# Patient Record
Sex: Male | Born: 1935 | Race: White | Hispanic: No | Marital: Married | Smoking: Former smoker
Health system: Southern US, Community
[De-identification: ages and names within clinical notes are randomized; demographics above are authoritative.]

## PROBLEM LIST (undated history)

## (undated) DIAGNOSIS — E039 Hypothyroidism, unspecified: Secondary | ICD-10-CM

## (undated) DIAGNOSIS — G9389 Other specified disorders of brain: Secondary | ICD-10-CM

## (undated) DIAGNOSIS — Z87891 Personal history of nicotine dependence: Secondary | ICD-10-CM

## (undated) DIAGNOSIS — I10 Essential (primary) hypertension: Secondary | ICD-10-CM

## (undated) DIAGNOSIS — N529 Male erectile dysfunction, unspecified: Secondary | ICD-10-CM

## (undated) DIAGNOSIS — I2510 Atherosclerotic heart disease of native coronary artery without angina pectoris: Secondary | ICD-10-CM

## (undated) DIAGNOSIS — M1990 Unspecified osteoarthritis, unspecified site: Secondary | ICD-10-CM

## (undated) DIAGNOSIS — E785 Hyperlipidemia, unspecified: Secondary | ICD-10-CM

## (undated) DIAGNOSIS — I214 Non-ST elevation (NSTEMI) myocardial infarction: Secondary | ICD-10-CM

## (undated) DIAGNOSIS — K635 Polyp of colon: Secondary | ICD-10-CM

## (undated) DIAGNOSIS — E291 Testicular hypofunction: Secondary | ICD-10-CM

## (undated) DIAGNOSIS — Z8739 Personal history of other diseases of the musculoskeletal system and connective tissue: Secondary | ICD-10-CM

## (undated) HISTORY — DX: Male erectile dysfunction, unspecified: N52.9

## (undated) HISTORY — DX: Unspecified osteoarthritis, unspecified site: M19.90

## (undated) HISTORY — DX: Polyp of colon: K63.5

## (undated) HISTORY — DX: Other specified disorders of brain: G93.89

## (undated) HISTORY — DX: Hyperlipidemia, unspecified: E78.5

## (undated) HISTORY — PX: ESOPHAGOGASTRODUODENOSCOPY: SHX1529

## (undated) HISTORY — DX: Hypothyroidism, unspecified: E03.9

## (undated) HISTORY — PX: LARYNX SURGERY: SHX692

## (undated) HISTORY — PX: LUMBAR DISC SURGERY: SHX700

## (undated) HISTORY — DX: Non-ST elevation (NSTEMI) myocardial infarction: I21.4

## (undated) HISTORY — DX: Essential (primary) hypertension: I10

## (undated) HISTORY — DX: Personal history of nicotine dependence: Z87.891

## (undated) HISTORY — DX: Testicular hypofunction: E29.1

## (undated) HISTORY — DX: Personal history of other diseases of the musculoskeletal system and connective tissue: Z87.39

## (undated) HISTORY — DX: Atherosclerotic heart disease of native coronary artery without angina pectoris: I25.10

---

## 1997-07-27 ENCOUNTER — Other Ambulatory Visit: Admission: RE | Admit: 1997-07-27 | Discharge: 1997-07-27 | Payer: Self-pay | Admitting: Urology

## 1998-01-02 ENCOUNTER — Emergency Department (HOSPITAL_COMMUNITY): Admission: EM | Admit: 1998-01-02 | Discharge: 1998-01-02 | Payer: Self-pay | Admitting: Family Medicine

## 1998-01-02 ENCOUNTER — Encounter: Payer: Self-pay | Admitting: Emergency Medicine

## 1998-01-05 ENCOUNTER — Emergency Department (HOSPITAL_COMMUNITY): Admission: EM | Admit: 1998-01-05 | Discharge: 1998-01-05 | Payer: Self-pay | Admitting: Emergency Medicine

## 1998-02-13 ENCOUNTER — Encounter: Payer: Self-pay | Admitting: *Deleted

## 1998-02-13 ENCOUNTER — Ambulatory Visit: Admission: RE | Admit: 1998-02-13 | Discharge: 1998-02-13 | Payer: Self-pay | Admitting: *Deleted

## 2007-02-10 DIAGNOSIS — I214 Non-ST elevation (NSTEMI) myocardial infarction: Secondary | ICD-10-CM

## 2007-02-10 HISTORY — PX: CARDIAC CATHETERIZATION: SHX172

## 2007-02-10 HISTORY — DX: Non-ST elevation (NSTEMI) myocardial infarction: I21.4

## 2007-02-10 HISTORY — PX: INGUINAL HERNIA REPAIR: SHX194

## 2007-02-17 ENCOUNTER — Inpatient Hospital Stay (HOSPITAL_COMMUNITY): Admission: EM | Admit: 2007-02-17 | Discharge: 2007-02-19 | Payer: Self-pay | Admitting: Emergency Medicine

## 2007-04-07 ENCOUNTER — Emergency Department (HOSPITAL_COMMUNITY): Admission: EM | Admit: 2007-04-07 | Discharge: 2007-04-07 | Payer: Self-pay | Admitting: Emergency Medicine

## 2010-06-24 NOTE — Cardiovascular Report (Signed)
[name] Potter, [name] Potter              ACCOUNT NO.:  [id number]   MEDICAL RECORD NO.:  [id number]          PATIENT TYPE:  INP   LOCATION:  3729                         FACILITY:  [facility]   PHYSICIAN:  Darlin Priestly, MD  DATE OF BIRTH:  1935/12/19   DATE OF PROCEDURE:  02/18/2007  DATE OF DISCHARGE:                            CARDIAC CATHETERIZATION   PROCEDURE:  1. Left heart catheterization.  2. Coronary angiography.  3. Left ventriculogram.  4. Valvular aortogram.   COMPLICATIONS:  None.   INDICATIONS:  Mr. [name] Potter is a 75 year old male patient followed [facility] with a history of hypertension, hypothyroidism,  history of remote tobacco use, quit in July 2007 who was admitted to the  ER on February 17, 2007, with stuttering chest pain on for the last 2  days.  He did such a rule in with a troponin of 1.45.  He is now  referred for cardiac catheterization to rule out significant CAD.   DESCRIPTION OF OPERATION:  After informed consent, the patient was sent  to the cardiac cath lab.  Right groin shaved, prepped and draped in  usual sterile fashion.  Monitors were established using modified  Seldinger.  A 6-French arterial sheath in right femoral artery.  A 6-  [place] diagnostic catheter was used to perform diagnostic angiography.   Left main is a medium-sized vessel with no disease.   The LAD is noted to be diffusely calcified throughout its proximal  midsegment.  There is diffuse 50-60% scattered lesions throughout the  proximal mid and early distal portion of the LAD.  However, none of  these appear to be high-grade.   First diagonal is a small vessel which bifurcates distally to the 95%  ostial lesion.   Second diagonal is small vessel with no disease.   Left circumflex is a medium-sized vessel coursing to the AV groove and  gives a two obtuse marginal branches.  AV circumflex noted a 40%  stenosis prior to the takeoff of the second obtuse marginal.   The  first OM is a medium-size vessel with 95% ostial lesion followed by  diffuse 50% scattered disease.   Second OM is a medium-size vessel which bifurcates distally.  In the  early prior bifurcation, there is a small 90% lower bifurcating lesion.   The right coronary artery is a medium size vessel that is dominant,  gives rise to a PDA versus the posterolateral branch.  RCA is noted at  30% proximal, 50% early mid, 40% distal disease.  The PLA is a small to  medium vessel with a 95% midvessel lesion at a bifurcation.   Left ventriculogram reveals a preserved EF of 60%.   Abdominal aortogram reveals small infrarenal aortic aneurysm.  There is  mild 30% right renal artery stenosis.   HEMODYNAMIC RESULTS:  Systemic arterial pressure 120/62, LV systemic  pressure 122/3, LVDP of 10.   CONCLUSION:  1. Significant three-vessel CAD involving small vessel branches.  2. Normal LV systolic function.  3. Small infrarenal aortic aneurysm.  4. Mild right renal artery stenosis.  Darlin Priestly, MD  Electronically Signed     RHM/MEDQ  D:  02/18/2007  T:  02/18/2007  Job:  [id number]   cc:   Nicki Guadalajara, M.D.

## 2010-06-27 NOTE — Discharge Summary (Signed)
[name] Potter, [name] NO.:  [id number]   MEDICAL RECORD NO.:  [id number]          PATIENT TYPE:  INP   LOCATION:  3729                         FACILITY:  [facility]   PHYSICIAN:  Nicki Guadalajara, M.D.     DATE OF BIRTH:  Jun 26, 1935   DATE OF ADMISSION:  02/17/2007  DATE OF DISCHARGE:  02/19/2007                               DISCHARGE SUMMARY   DISCHARGE DIAGNOSES:  1. Unstable angina, catheterization this admission revealing diffuse      small vessel disease to be treated medically.  2. Preserved left ventricular function.  3. Treated hypertension.  4. Dyslipidemia.  The patient has been intolerant to statins in the      past.  5. Prior back surgery.  6. History of osteomyelitis.  7. Amputation of the right second digit.  8. Smoking, quit July 2007.   HOSPITAL COURSE:  The patient is a 75 year old male who is followed at  the [place] in [place].  He presented February 17, 2007, with chest pain  consistent with unstable angina.  Medications at home are HCTZ,  Synthroid and fish oil.  He was admitted to telemetry.  Initial point a  care markers were negative.  Diagnostic catheterization was done February 18, 2007, which revealed 50% proximal RCA, 95% distal RCA, normal left  main, 95% proximal first diagonal, 60% distal LAD, 40% circumflex, 95%  proximal OM-1, 90% distal circumflex.  Plan is for medical therapy for  now.  The patient was transferred to telemetry and ambulated and we feel  he can be discharged February 19, 2007.  We did arrange for him to get a  days worth of medication until he can get to the [place].   DISCHARGE MEDICATIONS:  1. Synthroid at his home dose.  2. Metoprolol 25 mg b.i.d.  3. Fish oil two caps twice a day.  4. Nitro-Dur 0.2 mg patch on at 8 a.m., off at 8 p.m.  5. Benazepril 2.5 mg a day.  6. Simvastatin 80 mg 1/2 tablet a day.  7. Coated aspirin 325 mg a day.  8. Nitroglycerin sublingual p.r.n.  9. Plavix 75 mg a day.   LABORATORY DATA  AND X-RAY FINDINGS:  White count 7.2, hemoglobin 13.0,  hematocrit 37.6, platelets 185.  INR is 1.1.  Sodium 137, potassium 3.9,  BUN 11, creatinine 1.0.  Liver functions were normal.  CKs did bump to  185 with 16 MBs and a troponin of 1.45.  Cholesterol is 219, HDL 27,  triglycerides 231.  TSH 3.69, LDL 146.  UA is negative.   Telemetry shows sinus rhythm, sinus bradycardia.  EKG is not in the  chart, but is reported to show sinus rhythm, nonspecific T-wave changes,  but no acute changes.   DISPOSITION:  The patient is discharged.  It appears he actually had an  SEMI by enzymes.  Plan is for medical therapy.  Dr. Jenne Campus did note  that he could consider PCI of the RCA if he had recurrent unstable  angina.  He will follow up at the [place].      [name] Potter, P.A.  ______________________________  Nicki Guadalajara, M.D.    Lenard Lance  D:  03/07/2007  T:  03/08/2007  Job:  [id number]

## 2010-10-30 LAB — DIFFERENTIAL
Basophils Absolute: 0.1
Basophils Relative: 1
Eosinophils Absolute: 0.2
Eosinophils Relative: 3
Lymphocytes Relative: 22
Lymphs Abs: 1.5
Monocytes Absolute: 0.8
Monocytes Relative: 12
Neutro Abs: 4.1
Neutrophils Relative %: 62

## 2010-10-30 LAB — CBC
HCT: 42.5
Hemoglobin: 14.8
MCHC: 33.9
MCHC: 34.6
MCHC: 34.7
MCV: 88.5
MCV: 89.8
Platelets: 185
Platelets: 252
RBC: 4.44
RBC: 4.81
RDW: 12.8
WBC: 6.3
WBC: 6.6
WBC: 7.2

## 2010-10-30 LAB — COMPREHENSIVE METABOLIC PANEL WITH GFR
ALT: 27
AST: 32
Alkaline Phosphatase: 87
GFR calc Af Amer: >60
Glucose, Bld: 115 — ABNORMAL HIGH
Potassium: 3.5
Sodium: 137
Total Protein: 6.6

## 2010-10-30 LAB — BASIC METABOLIC PANEL
BUN: 11
Calcium: 9.1
Chloride: 103
Creatinine, Ser: 0.86
Creatinine, Ser: 1
GFR calc Af Amer: >60
GFR calc non Af Amer: >60

## 2010-10-30 LAB — I-STAT 8, (EC8 V) (CONVERTED LAB)
Acid-Base Excess: 3 — ABNORMAL HIGH
BUN: 10
Bicarbonate: 26.9 — ABNORMAL HIGH
Chloride: 104
Glucose, Bld: 166 — ABNORMAL HIGH
HCT: 47
Hemoglobin: 16
Operator id: 161631
Potassium: 3.8
Sodium: 136
TCO2: 28
pCO2, Ven: 37.2 — ABNORMAL LOW
pH, Ven: 7.468 — ABNORMAL HIGH

## 2010-10-30 LAB — URINALYSIS, ROUTINE W REFLEX MICROSCOPIC
Nitrite: NEGATIVE
Protein, ur: NEGATIVE
Urobilinogen, UA: 1

## 2010-10-30 LAB — TROPONIN I
Troponin I: 0.46 — ABNORMAL HIGH
Troponin I: 1.45

## 2010-10-30 LAB — POCT CARDIAC MARKERS
CKMB, poc: 5.7
Myoglobin, poc: 223
Operator id: 161631
Troponin i, poc: <0.05

## 2010-10-30 LAB — CK TOTAL AND CKMB (NOT AT ARMC)
CK, MB: 10.2 — ABNORMAL HIGH
CK, MB: 16.1 — ABNORMAL HIGH
Relative Index: 6 — ABNORMAL HIGH
Relative Index: 8.7 — ABNORMAL HIGH
Total CK: 170
Total CK: 185

## 2010-10-30 LAB — COMPREHENSIVE METABOLIC PANEL
Albumin: 3.7
BUN: 10
CO2: 27
Calcium: 9.2
Chloride: 103
Creatinine, Ser: 0.86
GFR calc non Af Amer: >60
Total Bilirubin: 0.4

## 2010-10-30 LAB — MAGNESIUM: Magnesium: 2.1

## 2010-10-30 LAB — APTT: aPTT: 71 — ABNORMAL HIGH

## 2010-10-30 LAB — POCT I-STAT CREATININE
Creatinine, Ser: 1
Operator id: 161631

## 2010-10-30 LAB — LIPID PANEL
Triglycerides: 231 — ABNORMAL HIGH
VLDL: 46 — ABNORMAL HIGH

## 2010-10-30 LAB — HEPARIN LEVEL (UNFRACTIONATED): Heparin Unfractionated: 0.32

## 2010-10-31 LAB — POCT CARDIAC MARKERS
CKMB, poc: 1.9
CKMB, poc: 2.8
Myoglobin, poc: 53
Myoglobin, poc: 62.9
Operator id: 133351
Operator id: 133351
Troponin i, poc: <0.05
Troponin i, poc: <0.05

## 2010-10-31 LAB — I-STAT 8, (EC8 V) (CONVERTED LAB)
Bicarbonate: 23.6
Glucose, Bld: 117 — ABNORMAL HIGH
Sodium: 139
TCO2: 25
pCO2, Ven: 33 — ABNORMAL LOW
pH, Ven: 7.463 — ABNORMAL HIGH

## 2010-10-31 LAB — POCT I-STAT CREATININE: Operator id: 133351

## 2011-03-12 ENCOUNTER — Ambulatory Visit (INDEPENDENT_AMBULATORY_CARE_PROVIDER_SITE_OTHER): Payer: Medicare HMO | Admitting: Family Medicine

## 2011-03-12 ENCOUNTER — Encounter: Payer: Self-pay | Admitting: Family Medicine

## 2011-03-12 VITALS — BP 106/61 | HR 61 | Temp 98.1°F | Wt 181.0 lb

## 2011-03-12 DIAGNOSIS — N410 Acute prostatitis: Secondary | ICD-10-CM | POA: Insufficient documentation

## 2011-03-12 DIAGNOSIS — R300 Dysuria: Secondary | ICD-10-CM

## 2011-03-12 LAB — POCT URINALYSIS DIPSTICK
Nitrite, UA: NEGATIVE
Urobilinogen, UA: 0.2
pH, UA: 5.5

## 2011-03-12 MED ORDER — CIPROFLOXACIN HCL 500 MG PO TABS: 500.0000 mg | ORAL_TABLET | Freq: Two times a day (BID) | ORAL | Status: AC

## 2011-03-12 NOTE — Progress Notes (Signed)
Office Note 03/12/2011  CC:  Chief Complaint  Patient presents with  . Establish Care    burning with urination, nocturia, small output, fever    HPI:  [name] Potter is a 76 y.o. White male who is here to establish care and discuss urinary complaints. [facility] [place] [facility] [place] [facility]. Old records in EPIC/HL reviewed prior to or during today's visit.  Onset 3 days ago of fatigue, dysuria, urinary frequency, nocturia/urgency, incomplete emptying.  Tactile fever x 1 night.  No hematuria.  Urine is dark as per his usual. Denies past hx of prostate infection or UTI. Describes nocturia x 3 usually but no significant bladder obstructive sx's normally. Feels better today. Thinks last prostate ca screening was about 6 yrs ago.  Past Medical History  Diagnosis Date  . HTN (hypertension)   . Hypothyroidism   . History of tobacco abuse quit 2007  . 3-vessel CAD     small vessel dz on cath 02/2007; medical mgmt  (Dr. Jenne Campus)  . Hyperlipidemia     Hx of intolerance to statins  . History of osteomyelitis   . Myocardial infarction, subendocardial 02/2007    by enzymes  . Osteoarthritis     mainly left thumb  . Colon polyp     ?hyperplastic; pt was told to repeat in 10 yrs per his report.  . Hypogonadism male     topical replacement rx'd by his [place] cardiologist was not helpful per pt  . Erectile dysfunction     Past Surgical History  Procedure Date  . Cardiac catheterization 02/2007    3 vessel CAD, normal EF, small infrarenal AAA, mild right RAS (30%)  . Lumbar disc surgery 30+ yrs ago    discectomy x 1  . Inguinal hernia repair 2009    right    Family History  Problem Relation Age of Onset  . Diabetes Mother   . Cancer Sister     lung cancer, smoker    History   Social History  . Marital Status: Married    Spouse Name: N/A    Number of Children: N/A  . Years of Education: N/A   Occupational  History  . Not on file.   Social History Main Topics  . Smoking status: Former Smoker    Types: Cigarettes    Quit date: 02/10/2007  . Smokeless tobacco: Former Neurosurgeon    Types: Chew    Quit date: 02/10/2011   Comment: pt began chewing tobacco after stopping cigs in 2009, quit chew in 02/2011  . Alcohol Use: No  . Drug Use: No  . Sexually Active: Not on file   Other Topics Concern  . Not on file   Social History Narrative   Married x 50 yrs, 2 children, one is deceased.Orig from [place] area, living in [place].Retired Psychologist, occupational (2011).  Was in the [employer]; no combat.Tobacco 65 pack-yr hx, quit 2007/08.Chewed tobacco x 4-5 yrs, quit 2013.Exercises 3-4 days per week at [facility].    Outpatient Encounter Prescriptions as of 03/12/2011  Medication Sig Dispense Refill  . aspirin 325 MG EC tablet 1/2 tab daily      . cyanocobalamin 500 MCG tablet Take 1,000 mcg by mouth daily.      Marland Kitchen levothyroxine (SYNTHROID, LEVOTHROID) 137 MCG tablet Take 137 mcg by mouth daily.      Marland Kitchen lisinopril-hydrochlorothiazide (PRINZIDE,ZESTORETIC) 20-12.5 MG per tablet Take 1 tablet by mouth daily.      Marland Kitchen  metoprolol tartrate (LOPRESSOR) 25 MG tablet Take 12.5 mg by mouth 2 (two) times daily.      . Omega-3 Fatty Acids (FISH OIL) 1000 MG CAPS Take 1 capsule by mouth daily.      . rosuvastatin (CRESTOR) 40 MG tablet Take 20 mg by mouth daily.      . ciprofloxacin (CIPRO) 500 MG tablet Take 1 tablet (500 mg total) by mouth 2 (two) times daily.  28 tablet  0    No Known Allergies  ROS Review of Systems  Constitutional:       See HPI   HENT: Negative for congestion and sore throat.   Eyes: Negative for visual disturbance.  Respiratory: Negative for cough.   Cardiovascular: Negative for chest pain.  Gastrointestinal: Negative for nausea and abdominal pain.  Genitourinary:       See HPI  Musculoskeletal: Negative for back pain and joint swelling.  Skin: Negative for rash.  Neurological: Negative for weakness and  headaches.  Hematological: Negative for adenopathy.    PE; Blood pressure 106/61, pulse 61, temperature 98.1 F (36.7 C), temperature source Temporal, weight 181 lb (82.101 kg), SpO2 95.00%. Gen: Alert, well appearing.  Patient is oriented to person, place, time, and situation. ENT: Ears: EACs clear, normal epithelium.  TMs with good light reflex and landmarks bilaterally.  Eyes: no injection, icteris, swelling, or exudate.  EOMI, PERRLA. Nose: no drainage or turbinate edema/swelling.  No injection or focal lesion.  Mouth: lips without lesion/swelling.  Oral mucosa pink and moist.  Dentition intact and without obvious caries or gingival swelling.  Oropharynx without erythema, exudate, or swelling.  Neck - No masses or thyromegaly or limitation in range of motion CV: RRR, no m/r/g.   LUNGS: CTA bilat, nonlabored resps, good aeration in all lung fields. ABD: soft, NT, ND, BS normal.  No hepatospenomegaly or mass.  No bruits. EXT: no clubbing, cyanosis, or edema.  Rectal exam: negative without mass, lesions or tenderness, sphincter tone normal.  Prostate is fairly firm diffusely, without focal nodularity, mildly tender.  I don't appreciate significant prostate enlargement.  Pertinent labs:  UA showed moderate blood and moderate leukocytes, 100mg /dl protein, trace ketones  ASSESSMENT AND PLAN:   New pt: obtain old records.  Prostatitis, acute Cipro 500mg  bid x14d. Sent urine for c/s. Call or return if sx's not improved in about 5d.   He'll continue to get the majority of his routine chronic medical f/u via the [place] in [place]. He'll continue to see his cardiologist at [facility].  Return if symptoms worsen or fail to improve.

## 2011-03-12 NOTE — Assessment & Plan Note (Signed)
Cipro 500mg  bid x14d. Sent urine for c/s. Call or return if sx's not improved in about 5d.

## 2011-03-15 LAB — URINE CULTURE: Colony Count: >=100000

## 2011-03-16 ENCOUNTER — Encounter: Payer: Self-pay | Admitting: Family Medicine

## 2012-01-27 ENCOUNTER — Ambulatory Visit (INDEPENDENT_AMBULATORY_CARE_PROVIDER_SITE_OTHER): Payer: Medicare HMO | Admitting: Family Medicine

## 2012-01-27 ENCOUNTER — Encounter: Payer: Self-pay | Admitting: Family Medicine

## 2012-01-27 VITALS — BP 145/72 | HR 67 | Temp 98.8°F | Wt 182.0 lb

## 2012-01-27 DIAGNOSIS — J069 Acute upper respiratory infection, unspecified: Secondary | ICD-10-CM

## 2012-01-27 DIAGNOSIS — J209 Acute bronchitis, unspecified: Secondary | ICD-10-CM

## 2012-01-27 MED ORDER — PREDNISONE 20 MG PO TABS: ORAL_TABLET | ORAL | Status: DC

## 2012-01-27 MED ORDER — AZITHROMYCIN 250 MG PO TABS: ORAL_TABLET | ORAL | Status: DC

## 2012-01-27 NOTE — Assessment & Plan Note (Signed)
With prolonged URI sx's as well. No sign of RAD. Will give prednisone 40mg  qd x 5d and azithromycin x 5d (Z pack). Continue tessalon perles or may also try OTC mucinex DM prn.

## 2012-01-27 NOTE — Patient Instructions (Addendum)
Try mucinex DM (OTC) or generic robitussin DM as per the instructions on the container. Use OTC nasal saline spray (generic) 2-3 times per day to irrigate and moisturize your nasal passages.

## 2012-01-27 NOTE — Progress Notes (Signed)
OFFICE NOTE  01/27/2012  CC:  Chief Complaint  Patient presents with  . Cough    since October, yellow phlegm     HPI: Patient is a 76 y.o. Caucasian male who is here for respiratory complaints. Pt presents complaining of respiratory symptoms for 2 months.  Primary symptoms are: cough, sinus pain, coughs up yellow phlegm.  Worst symptoms seems to be the cough.  Lately the symptoms seem to be waxing and waning--worse in daytime, ok in evenings.   [facility] and says he was rx'd tessalon perles and a nasal spray.  He does have some intermittent wheezing and feeling of chest tightness.  Has no inhaler at home. Pertinent negatives: No fevers.  Throat is normal.  Symptoms made worse by nothing.  Symptoms improved by evening time. Smoker? former Recent sick contact? Yes-wife Muscle or joint aches? no Flu shot this season at least 2 wks ago? yes  Additional ROS: no n/v/d or abdominal pain.  No rash.  No neck stiffness.   +Mild fatigue.  +Mild appetite loss.   Pertinent PMH:  Past Medical History  Diagnosis Date  . HTN (hypertension)   . Hypothyroidism   . History of tobacco abuse quit 2007  . 3-vessel CAD     small vessel dz on cath 02/2007; medical mgmt .  ETT at [facility] 01/23/11 was normal per pt report.  . Hyperlipidemia     Hx of intolerance to various statins  . History of osteomyelitis   . Myocardial infarction, subendocardial 02/2007    by enzymes  . Osteoarthritis     mainly left thumb  . Colon polyp     (The only old record available was a flex sig from 03/1998, which showed sigmoid diverticulosis and internal hemorrhoids--no polyps(?hyperplastic; pt was told to repeat in 10 yrs per his report.)  . Hypogonadism male     topical replacement rx'd by his [place] cardiologist was not helpful per pt  . Erectile dysfunction     MEDS:  Outpatient Prescriptions Prior to Visit  Medication Sig Dispense Refill  . aspirin 325 MG EC tablet 1/2 tab daily      .  cyanocobalamin 500 MCG tablet Take 1,000 mcg by mouth daily.      Marland Kitchen levothyroxine (SYNTHROID, LEVOTHROID) 137 MCG tablet Take 137 mcg by mouth daily.      Marland Kitchen lisinopril-hydrochlorothiazide (PRINZIDE,ZESTORETIC) 20-12.5 MG per tablet Take 1 tablet by mouth daily.      . metoprolol tartrate (LOPRESSOR) 25 MG tablet Take 12.5 mg by mouth 2 (two) times daily.      . Omega-3 Fatty Acids (FISH OIL) 1000 MG CAPS Take 2 capsules by mouth 2 (two) times daily.       . rosuvastatin (CRESTOR) 40 MG tablet Take 20 mg by mouth daily.       Last reviewed on 01/27/2012  9:40 AM by Jeoffrey Massed, MD  PE: Blood pressure 145/72, pulse 67, temperature 98.8 F (37.1 C), temperature source Temporal, weight 182 lb (82.555 kg), SpO2 96.00%. VS: noted--normal. Gen: alert, NAD, NONTOXIC APPEARING. HEENT: eyes without injection, drainage, or swelling.  Ears: EACs clear, TMs with normal light reflex and landmarks.  Nose: Clear rhinorrhea, with some dried, crusty exudate adherent to mildly injected mucosa.  No purulent d/c.  No paranasal sinus TTP.  No facial swelling.  Throat and mouth without focal lesion.  No pharyngial swelling, erythema, or exudate.   Neck: supple, no LAD.   LUNGS: CTA bilat, nonlabored  resps.   CV: RRR, no m/r/g. EXT: no c/c/e SKIN: no rash   IMPRESSION AND PLAN:  Acute bronchitis With prolonged URI sx's as well. No sign of RAD. Will give prednisone 40mg  qd x 5d and azithromycin x 5d (Z pack). Continue tessalon perles or may also try OTC mucinex DM prn.   An After Visit Summary was printed and given to the patient.  FOLLOW UP: 10-14d

## 2012-09-08 ENCOUNTER — Ambulatory Visit (INDEPENDENT_AMBULATORY_CARE_PROVIDER_SITE_OTHER): Payer: Medicare HMO | Admitting: Family Medicine

## 2012-09-08 ENCOUNTER — Encounter: Payer: Self-pay | Admitting: Family Medicine

## 2012-09-08 VITALS — BP 147/64 | HR 44 | Temp 99.1°F | Resp 16 | Ht 70.0 in | Wt 181.0 lb

## 2012-09-08 DIAGNOSIS — S20219A Contusion of unspecified front wall of thorax, initial encounter: Secondary | ICD-10-CM | POA: Insufficient documentation

## 2012-09-08 DIAGNOSIS — S20212A Contusion of left front wall of thorax, initial encounter: Secondary | ICD-10-CM

## 2012-09-08 DIAGNOSIS — F0390 Unspecified dementia without behavioral disturbance: Secondary | ICD-10-CM

## 2012-09-08 DIAGNOSIS — D496 Neoplasm of unspecified behavior of brain: Secondary | ICD-10-CM | POA: Insufficient documentation
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
## 2012-09-08 NOTE — Assessment & Plan Note (Signed)
Again, I was unaware of this dx in patient. He is on a med he can't recall the name of (from the [place]). ? Related to recently diagnosed brain tumor? [facility].

## 2012-09-08 NOTE — Assessment & Plan Note (Signed)
Patient makes it sounds like this is potentially curable.  He is set to go to [facility] for further eval/mgmt of this in the next week or so.

## 2013-01-16 ENCOUNTER — Encounter: Payer: Self-pay | Admitting: Family Medicine

## 2013-01-16 ENCOUNTER — Ambulatory Visit (INDEPENDENT_AMBULATORY_CARE_PROVIDER_SITE_OTHER): Payer: Medicare HMO | Admitting: Family Medicine

## 2013-01-16 VITALS — BP 124/77 | HR 54 | Temp 99.7°F | Resp 18 | Ht 70.0 in | Wt 182.0 lb

## 2013-01-16 DIAGNOSIS — J069 Acute upper respiratory infection, unspecified: Secondary | ICD-10-CM

## 2013-01-16 DIAGNOSIS — J209 Acute bronchitis, unspecified: Secondary | ICD-10-CM

## 2013-01-16 MED ORDER — AZITHROMYCIN 250 MG PO TABS: ORAL_TABLET | ORAL | Status: DC

## 2013-01-16 MED ORDER — METHYLPREDNISOLONE ACETATE 40 MG/ML IJ SUSP
40.0000 mg | Freq: Once | INTRAMUSCULAR | Status: AC
  Administered 2013-01-16: 40 mg via INTRAMUSCULAR

## 2013-01-16 MED ORDER — IPRATROPIUM BROMIDE 0.03 % NA SOLN: NASAL | Status: DC

## 2013-01-16 NOTE — Progress Notes (Addendum)
OFFICE NOTE Pre visit review using our clinic review tool, if applicable. No additional management support is needed unless otherwise documented below in the visit note.  01/16/2013  CC:  Chief Complaint  Patient presents with  . Nasal Congestion  . Cough     HPI: Patient is a 76 y.o. Caucasian male who is here for congestion/cough. Since I last saw him he has seen a specialist at [facility] for brain tumor, pt says he was told it was benign and he has f/u next month to see if it has grown any.  It was recommended that no surgery be done at this time.  Takes ibuprofen bid to help mild HAs he attributes to the tumor.  Onset of cough 3 d/a, fatigue, no fevers, nose runny for a couple of weeks now, has paranasal sinus pain/pressure.  No meds tried.  He has had his flu vaccine already per pt (at the [place]).  No SOB or chest pain.    Pertinent PMH:  Past Medical History  Diagnosis Date  . HTN (hypertension)   . Hypothyroidism   . History of tobacco abuse quit 2007  . 3-vessel CAD     small vessel dz on cath 02/2007; medical mgmt .  ETT at [facility] 01/23/11 was normal per pt report.  . Hyperlipidemia     Hx of intolerance to various statins  . History of osteomyelitis   . Myocardial infarction, subendocardial 02/2007    by enzymes  . Osteoarthritis     mainly left thumb  . Colon polyp     (The only old record available was a flex sig from 03/1998, which showed sigmoid diverticulosis and internal hemorrhoids--no polyps(?hyperplastic; pt was told to repeat in 10 yrs per his report.)  . Hypogonadism male     topical replacement rx'd by his [place] cardiologist was not helpful per pt  . Erectile dysfunction    Past surgical, social, and family history reviewed and no changes noted since last office visit.  MEDS:  Outpatient Prescriptions Prior to Visit  Medication Sig Dispense Refill  . aspirin 81 MG tablet Take 81 mg by mouth 2 (two) times daily.      . cyanocobalamin 500 MCG tablet Take 1,000  mcg by mouth daily.      Marland Kitchen levothyroxine (SYNTHROID, LEVOTHROID) 137 MCG tablet Take 137 mcg by mouth daily.      Marland Kitchen lisinopril-hydrochlorothiazide (PRINZIDE,ZESTORETIC) 20-12.5 MG per tablet Take 1 tablet by mouth daily.      . metoprolol tartrate (LOPRESSOR) 25 MG tablet Take 12.5 mg by mouth 2 (two) times daily.      . Omega-3 Fatty Acids (FISH OIL) 1000 MG CAPS Take 2 capsules by mouth 2 (two) times daily.       Marland Kitchen aspirin 325 MG EC tablet 1/2 tab daily      . azithromycin (ZITHROMAX) 250 MG tablet 2 tabs po qd x 1d, then 1 tab po qd x 4d  6 each  0  . predniSONE (DELTASONE) 20 MG tablet 2 tabs po qd x 5d  10 tablet  0  . rosuvastatin (CRESTOR) 40 MG tablet Take 20 mg by mouth daily.       No facility-administered medications prior to visit.    PE: Blood pressure 124/77, pulse 54, temperature 99.7 F (37.6 C), temperature source Temporal, resp. rate 18, height 5\' 10"  (1.778 m), weight 182 lb (82.555 kg), SpO2 98.00%. VS: noted--normal. Gen: alert, NAD, NONTOXIC APPEARING. HEENT: eyes without injection, drainage, or  swelling.  Ears: EACs clear, TMs with normal light reflex and landmarks.  Nose: Clear rhinorrhea, with some dried, crusty exudate adherent to mildly injected mucosa.  No purulent d/c.  No paranasal sinus TTP.  No facial swelling.  Throat and mouth without focal lesion.  No pharyngial swelling, erythema, or exudate.   Neck: supple, no LAD.   LUNGS: CTA bilat, nonlabored resps.   CV: RRR, no m/r/g. EXT: no c/c/e SKIN: no rash  IMPRESSION AND PLAN:  Prolonged URI with early bronchitis. Azithromycin x 5d, Depo-medrol 40mg  IM in office today. Recommended ipratropium nasal spray 0.3% tid prn. Also recommended OTC generic robitussin DM for cough.  FOLLOW UP: prn

## 2013-01-16 NOTE — Patient Instructions (Signed)
Buy generic robitussin DM to help with your cough.

## 2013-05-18 ENCOUNTER — Ambulatory Visit (INDEPENDENT_AMBULATORY_CARE_PROVIDER_SITE_OTHER): Payer: Medicare HMO | Admitting: Family Medicine

## 2013-05-18 ENCOUNTER — Encounter: Payer: Self-pay | Admitting: Family Medicine

## 2013-05-18 VITALS — BP 121/72 | HR 50 | Temp 98.4°F | Resp 20 | Ht 70.0 in | Wt 181.0 lb

## 2013-05-18 DIAGNOSIS — H60509 Unspecified acute noninfective otitis externa, unspecified ear: Secondary | ICD-10-CM

## 2013-05-18 DIAGNOSIS — H60399 Other infective otitis externa, unspecified ear: Secondary | ICD-10-CM

## 2013-05-18 MED ORDER — OFLOXACIN 0.3 % OT SOLN: 10.0000 [drp] | Freq: Every day | OTIC | Status: DC

## 2013-05-18 NOTE — Progress Notes (Signed)
Pre visit review using our clinic review tool, if applicable. No additional management support is needed unless otherwise documented below in the visit note. 

## 2013-05-18 NOTE — Progress Notes (Signed)
OFFICE NOTE  05/18/2013  CC:  Chief Complaint  Patient presents with  . Ear Drainage    both ear, left is worse  . Otalgia    x 3 weeks    HPI: Patient is a 78 y.o. Caucasian male who is here for ear pain. Onset 3-4 wks ago, both ears throbbing, draining, feels like he is talking in a drum.  He had a seizure 01/2013: describes tonic/clonic/generalized seizure. Got a w/u after, started on seizure med but he can't recall the name of it: all of this. He is followed by MDs at [facility] for this.   Pertinent PMH:  Past medical, surgical, social, hx reviewed.  MEDS: Not taking azithromycin listed below Outpatient Prescriptions Prior to Visit  Medication Sig Dispense Refill  . aspirin 81 MG tablet Take 81 mg by mouth 2 (two) times daily.      Marland Kitchen atorvastatin (LIPITOR) 80 MG tablet Take 80 mg by mouth daily.      Marland Kitchen azithromycin (ZITHROMAX) 250 MG tablet 2 tabs po qd x 1d, then 1 tab po qd x 4d  6 each  0  . cyanocobalamin 500 MCG tablet Take 1,000 mcg by mouth daily.      Marland Kitchen donepezil (ARICEPT) 10 MG tablet Take 10 mg by mouth at bedtime.      Marland Kitchen ipratropium (ATROVENT) 0.03 % nasal spray 2 sprays each nostril tid prn runny nose  30 mL  1  . levothyroxine (SYNTHROID, LEVOTHROID) 137 MCG tablet Take 137 mcg by mouth daily.      Marland Kitchen lisinopril-hydrochlorothiazide (PRINZIDE,ZESTORETIC) 20-12.5 MG per tablet Take 1 tablet by mouth daily.      . metoprolol tartrate (LOPRESSOR) 25 MG tablet Take 12.5 mg by mouth 2 (two) times daily.      . Omega-3 Fatty Acids (FISH OIL) 1000 MG CAPS Take 2 capsules by mouth 2 (two) times daily.        No facility-administered medications prior to visit.    PE: Height 5\' 10"  (1.778 m), weight 181 lb (82.101 kg). Gen: Alert, well appearing.  Patient is oriented to person, place, time, and situation. ENT: nose is clear.  No eye drainage, injection, or swelling. Throat without swelling, erythema, or lesion or PND.   EARS: mild pain with insertion of ear  speculum on both sides.  Mild erythema of EAC walls and light green pus noted in both EACs.  No swelling of the EAC walls.  Left TM intact.  I cannot see the right TM.  IMPRESSION AND PLAN:  Acute bilateral acute otitis externa. Floxin otic drops 10 drops each ear qd x 10d. Tylenol prn for pain.  Patient to call office later with the name of the seizure med he has been put on by [facility].  An After Visit Summary was printed and given to the patient.  FOLLOW UP: prn

## 2014-01-01 ENCOUNTER — Encounter: Payer: Self-pay | Admitting: Family Medicine

## 2014-01-01 ENCOUNTER — Ambulatory Visit (INDEPENDENT_AMBULATORY_CARE_PROVIDER_SITE_OTHER): Payer: Medicare HMO | Admitting: Family Medicine

## 2014-01-01 VITALS — BP 129/66 | HR 55 | Temp 98.1°F | Resp 18 | Ht 70.0 in | Wt 183.0 lb

## 2014-01-01 DIAGNOSIS — J309 Allergic rhinitis, unspecified: Secondary | ICD-10-CM

## 2014-01-01 DIAGNOSIS — J4521 Mild intermittent asthma with (acute) exacerbation: Secondary | ICD-10-CM

## 2014-01-01 MED ORDER — PREDNISONE 20 MG PO TABS: ORAL_TABLET | ORAL | Status: DC

## 2014-01-01 NOTE — Progress Notes (Signed)
Pre visit review using our clinic review tool, if applicable. No additional management support is needed unless otherwise documented below in the visit note. 

## 2014-01-01 NOTE — Progress Notes (Signed)
OFFICE NOTE  01/01/2014  CC:  Chief Complaint  Patient presents with  . Cough  . Ear Pain     HPI: Patient is a 78 y.o. Caucasian male who is here for about 5 days ago, worse the last 2d, lots of nasal/ear/facial fullness + cough.  No fever.  No wheezing or SOB.  Mucinex DM OTC helping some. Has been mulching leaves almost constantly the last 4-5 days.  Pertinent PMH:  Past medical, surgical, social, and family history reviewed and no changes are noted since last office visit.  MEDS:  Outpatient Prescriptions Prior to Visit  Medication Sig Dispense Refill  . aspirin 81 MG tablet Take 81 mg by mouth 2 (two) times daily.    Marland Kitchen atorvastatin (LIPITOR) 80 MG tablet Take 80 mg by mouth daily.    . cyanocobalamin 500 MCG tablet Take 1,000 mcg by mouth daily.    Marland Kitchen ipratropium (ATROVENT) 0.03 % nasal spray 2 sprays each nostril tid prn runny nose 30 mL 1  . levETIRAcetam (KEPPRA) 500 MG tablet Take 500 mg by mouth 2 (two) times daily.    Marland Kitchen levothyroxine (SYNTHROID, LEVOTHROID) 137 MCG tablet Take 137 mcg by mouth daily.    Marland Kitchen lisinopril-hydrochlorothiazide (PRINZIDE,ZESTORETIC) 20-12.5 MG per tablet Take 1 tablet by mouth daily.    . metoprolol tartrate (LOPRESSOR) 25 MG tablet Take 12.5 mg by mouth 2 (two) times daily.    . Omega-3 Fatty Acids (FISH OIL) 1000 MG CAPS Take 2 capsules by mouth 2 (two) times daily.     Marland Kitchen donepezil (ARICEPT) 10 MG tablet Take 10 mg by mouth at bedtime.    Marland Kitchen ofloxacin (FLOXIN) 0.3 % otic solution Place 10 drops into both ears daily. (Patient not taking: Reported on 01/01/2014) 10 mL 1   No facility-administered medications prior to visit.    PE: Blood pressure 129/66, pulse 55, temperature 98.1 F (36.7 C), temperature source Temporal, resp. rate 18, height 5\' 10"  (1.778 m), weight 183 lb (83.008 kg), SpO2 97 %. VS: noted--normal. Gen: alert, NAD, NONTOXIC APPEARING. HEENT: eyes without injection, drainage, or swelling.  Ears: EACs clear, TMs with normal  light reflex and landmarks.  Nose: Clear rhinorrhea, with some dried, crusty exudate adherent to mildly injected mucosa.  No purulent d/c.  No paranasal sinus TTP.  No facial swelling.  Throat and mouth without focal lesion.  No pharyngial swelling, erythema, or exudate.   Neck: supple, no LAD.   LUNGS: CTA bilat, nonlabored resps.   CV: RRR, no m/r/g. EXT: no c/c/e SKIN: no rash  IMPRESSION AND PLAN:  Acute flare of allergic rhinitis/sinusitis and mild bronchitis. Prednisone 40mg  qd x 2d, then 20mg  qd x 2d, then 10mg  qd x 2d. Saline nasal irrigation encouraged.  No mulching for next week or so.  An After Visit Summary was printed and given to the patient.  FOLLOW UP: prn

## 2015-01-23 ENCOUNTER — Encounter: Payer: Self-pay | Admitting: Gastroenterology

## 2015-05-22 ENCOUNTER — Telehealth: Payer: Self-pay | Admitting: Family Medicine

## 2015-05-22 NOTE — Telephone Encounter (Signed)
LM for patient to schedule an appt-last appt 01/01/14

## 2015-12-25 ENCOUNTER — Telehealth: Payer: Self-pay

## 2015-12-25 NOTE — Telephone Encounter (Signed)
LM for patient to return call to schedule AWV with [facility] and F/U with PCP.

## 2019-04-09 ENCOUNTER — Ambulatory Visit: Payer: Medicare HMO | Attending: Internal Medicine

## 2019-04-09 DIAGNOSIS — Z23 Encounter for immunization: Secondary | ICD-10-CM

## 2019-04-09 NOTE — Progress Notes (Signed)
   Covid-19 Vaccination Clinic  Name:  [name] Potter    MRN: [id number] DOB: 04-11-1935  04/09/2019  [name] was observed post Covid-19 immunization for 15 minutes without incidence. He was provided with Vaccine Information Sheet and instruction to access the V-Safe system.   [name] was instructed to call 911 with any severe reactions post vaccine: Marland Kitchen Difficulty breathing  . Swelling of your face and throat  . A fast heartbeat  . A bad rash all over your body  . Dizziness and weakness    Immunizations Administered    Name Date Dose VIS Date Route   Pfizer COVID-19 Vaccine 04/09/2019  8:45 AM 0.3 mL 01/20/2019 Intramuscular   Manufacturer: [place]   Lot: [id number]   [place]: [id number]

## 2019-05-03 ENCOUNTER — Ambulatory Visit: Payer: Medicare HMO | Attending: Internal Medicine

## 2019-05-03 DIAGNOSIS — Z23 Encounter for immunization: Secondary | ICD-10-CM

## 2019-05-03 NOTE — Progress Notes (Signed)
   Covid-19 Vaccination Clinic  Name:  [name] Potter    MRN: [id number] DOB: 05/07/1935  05/03/2019  [name] was observed post Covid-19 immunization for 15 minutes without incident. He was provided with Vaccine Information Sheet and instruction to access the V-Safe system.   [name] was instructed to call 911 with any severe reactions post vaccine: Marland Kitchen Difficulty breathing  . Swelling of face and throat  . A fast heartbeat  . A bad rash all over body  . Dizziness and weakness   Immunizations Administered    Name Date Dose VIS Date Route   Pfizer COVID-19 Vaccine 05/03/2019  2:08 PM 0.3 mL 01/20/2019 Intramuscular   Manufacturer: [place]   Lot: [id number]   [place]: [id number]

## 2019-07-03 ENCOUNTER — Emergency Department (HOSPITAL_COMMUNITY): Payer: Medicare HMO

## 2019-07-03 ENCOUNTER — Observation Stay (HOSPITAL_COMMUNITY)
Admission: EM | Admit: 2019-07-03 | Discharge: 2019-07-04 | Disposition: A | Discharge status: Home / Self Care | Payer: Medicare HMO | Attending: Internal Medicine | Admitting: Internal Medicine

## 2019-07-03 ENCOUNTER — Inpatient Hospital Stay (HOSPITAL_COMMUNITY): Payer: Medicare HMO

## 2019-07-03 DIAGNOSIS — M1990 Unspecified osteoarthritis, unspecified site: Secondary | ICD-10-CM | POA: Diagnosis not present

## 2019-07-03 DIAGNOSIS — I639 Cerebral infarction, unspecified: Secondary | ICD-10-CM | POA: Diagnosis not present

## 2019-07-03 DIAGNOSIS — I6340 Cerebral infarction due to embolism of unspecified cerebral artery: Secondary | ICD-10-CM | POA: Diagnosis not present

## 2019-07-03 DIAGNOSIS — G459 Transient cerebral ischemic attack, unspecified: Secondary | ICD-10-CM | POA: Diagnosis not present

## 2019-07-03 DIAGNOSIS — Z951 Presence of aortocoronary bypass graft: Secondary | ICD-10-CM | POA: Insufficient documentation

## 2019-07-03 DIAGNOSIS — F0390 Unspecified dementia without behavioral disturbance: Secondary | ICD-10-CM | POA: Diagnosis present

## 2019-07-03 DIAGNOSIS — D496 Neoplasm of unspecified behavior of brain: Secondary | ICD-10-CM | POA: Diagnosis not present

## 2019-07-03 DIAGNOSIS — I2510 Atherosclerotic heart disease of native coronary artery without angina pectoris: Secondary | ICD-10-CM | POA: Insufficient documentation

## 2019-07-03 DIAGNOSIS — R531 Weakness: Secondary | ICD-10-CM | POA: Diagnosis not present

## 2019-07-03 DIAGNOSIS — I252 Old myocardial infarction: Secondary | ICD-10-CM | POA: Insufficient documentation

## 2019-07-03 DIAGNOSIS — R29810 Facial weakness: Secondary | ICD-10-CM | POA: Diagnosis not present

## 2019-07-03 DIAGNOSIS — Z87440 Personal history of urinary (tract) infections: Secondary | ICD-10-CM | POA: Diagnosis not present

## 2019-07-03 DIAGNOSIS — G9608 Other cranial cerebrospinal fluid leak: Secondary | ICD-10-CM | POA: Diagnosis not present

## 2019-07-03 DIAGNOSIS — R569 Unspecified convulsions: Secondary | ICD-10-CM | POA: Diagnosis not present

## 2019-07-03 DIAGNOSIS — Z20822 Contact with and (suspected) exposure to covid-19: Secondary | ICD-10-CM | POA: Diagnosis not present

## 2019-07-03 DIAGNOSIS — R262 Difficulty in walking, not elsewhere classified: Secondary | ICD-10-CM | POA: Diagnosis not present

## 2019-07-03 DIAGNOSIS — I69351 Hemiplegia and hemiparesis following cerebral infarction affecting right dominant side: Secondary | ICD-10-CM | POA: Insufficient documentation

## 2019-07-03 DIAGNOSIS — E039 Hypothyroidism, unspecified: Secondary | ICD-10-CM | POA: Diagnosis not present

## 2019-07-03 DIAGNOSIS — I119 Hypertensive heart disease without heart failure: Secondary | ICD-10-CM | POA: Diagnosis not present

## 2019-07-03 DIAGNOSIS — R29818 Other symptoms and signs involving the nervous system: Secondary | ICD-10-CM | POA: Diagnosis not present

## 2019-07-03 DIAGNOSIS — Z7982 Long term (current) use of aspirin: Secondary | ICD-10-CM | POA: Diagnosis not present

## 2019-07-03 DIAGNOSIS — E119 Type 2 diabetes mellitus without complications: Secondary | ICD-10-CM | POA: Insufficient documentation

## 2019-07-03 DIAGNOSIS — R001 Bradycardia, unspecified: Secondary | ICD-10-CM | POA: Diagnosis not present

## 2019-07-03 DIAGNOSIS — Z79899 Other long term (current) drug therapy: Secondary | ICD-10-CM | POA: Insufficient documentation

## 2019-07-03 DIAGNOSIS — I10 Essential (primary) hypertension: Secondary | ICD-10-CM

## 2019-07-03 DIAGNOSIS — E785 Hyperlipidemia, unspecified: Secondary | ICD-10-CM | POA: Diagnosis not present

## 2019-07-03 DIAGNOSIS — R4182 Altered mental status, unspecified: Secondary | ICD-10-CM | POA: Diagnosis not present

## 2019-07-03 DIAGNOSIS — Z87891 Personal history of nicotine dependence: Secondary | ICD-10-CM | POA: Insufficient documentation

## 2019-07-03 DIAGNOSIS — R1310 Dysphagia, unspecified: Secondary | ICD-10-CM | POA: Diagnosis not present

## 2019-07-03 DIAGNOSIS — Z833 Family history of diabetes mellitus: Secondary | ICD-10-CM | POA: Diagnosis not present

## 2019-07-03 LAB — DIFFERENTIAL
Abs Immature Granulocytes: 0.01 10*3/uL (ref 0.00–0.07)
Basophils Absolute: 0.1 10*3/uL (ref 0.0–0.1)
Basophils Relative: 1 %
Eosinophils Absolute: 0.1 10*3/uL (ref 0.0–0.5)
Eosinophils Relative: 2 %
Immature Granulocytes: 0 %
Lymphocytes Relative: 23 %
Lymphs Abs: 1.5 10*3/uL (ref 0.7–4.0)
Monocytes Absolute: 0.8 10*3/uL (ref 0.1–1.0)
Monocytes Relative: 12 %
Neutro Abs: 3.8 10*3/uL (ref 1.7–7.7)
Neutrophils Relative %: 62 %

## 2019-07-03 LAB — COMPREHENSIVE METABOLIC PANEL
ALT: 30 U/L (ref 0–44)
AST: 28 U/L (ref 15–41)
Albumin: 3.7 g/dL (ref 3.5–5.0)
Alkaline Phosphatase: 69 U/L (ref 38–126)
Anion gap: 8 (ref 5–15)
BUN: 15 mg/dL (ref 8–23)
CO2: 26 mmol/L (ref 22–32)
Calcium: 9.4 mg/dL (ref 8.9–10.3)
Chloride: 104 mmol/L (ref 98–111)
Creatinine, Ser: 1.05 mg/dL (ref 0.61–1.24)
GFR calc Af Amer: >60 mL/min (ref >60)
GFR calc non Af Amer: >60 mL/min (ref >60)
Glucose, Bld: 95 mg/dL (ref 70–99)
Potassium: 4.2 mmol/L (ref 3.5–5.1)
Sodium: 138 mmol/L (ref 135–145)
Total Bilirubin: 0.6 mg/dL (ref 0.3–1.2)
Total Protein: 6.4 g/dL — ABNORMAL LOW (ref 6.5–8.1)

## 2019-07-03 LAB — URINALYSIS, ROUTINE W REFLEX MICROSCOPIC
Bilirubin Urine: NEGATIVE
Glucose, UA: NEGATIVE mg/dL
Hgb urine dipstick: NEGATIVE
Ketones, ur: NEGATIVE mg/dL
Leukocytes,Ua: NEGATIVE
Nitrite: NEGATIVE
Protein, ur: NEGATIVE mg/dL
Specific Gravity, Urine: 1.006 (ref 1.005–1.030)
pH: 7 (ref 5.0–8.0)

## 2019-07-03 LAB — CBC
HCT: 38.9 % — ABNORMAL LOW (ref 39.0–52.0)
Hemoglobin: 12.8 g/dL — ABNORMAL LOW (ref 13.0–17.0)
MCH: 31.6 pg (ref 26.0–34.0)
MCHC: 32.9 g/dL (ref 30.0–36.0)
MCV: 96 fL (ref 80.0–100.0)
Platelets: 212 10*3/uL (ref 150–400)
RBC: 4.05 MIL/uL — ABNORMAL LOW (ref 4.22–5.81)
RDW: 13.2 % (ref 11.5–15.5)
WBC: 6.3 10*3/uL (ref 4.0–10.5)
nRBC: 0 % (ref 0.0–0.2)

## 2019-07-03 LAB — I-STAT CHEM 8, ED
BUN: 16 mg/dL (ref 8–23)
Calcium, Ion: 1.21 mmol/L (ref 1.15–1.40)
Chloride: 102 mmol/L (ref 98–111)
Creatinine, Ser: 1.1 mg/dL (ref 0.61–1.24)
Glucose, Bld: 88 mg/dL (ref 70–99)
HCT: 37 % — ABNORMAL LOW (ref 39.0–52.0)
Hemoglobin: 12.6 g/dL — ABNORMAL LOW (ref 13.0–17.0)
Potassium: 4.1 mmol/L (ref 3.5–5.1)
Sodium: 138 mmol/L (ref 135–145)
TCO2: 28 mmol/L (ref 22–32)

## 2019-07-03 LAB — PROTIME-INR
INR: 1.1 (ref 0.8–1.2)
Prothrombin Time: 14.1 seconds (ref 11.4–15.2)

## 2019-07-03 LAB — APTT: aPTT: 27 seconds (ref 24–36)

## 2019-07-03 LAB — CBG MONITORING, ED: Glucose-Capillary: 86 mg/dL (ref 70–99)

## 2019-07-03 LAB — SARS CORONAVIRUS 2 BY RT PCR (HOSPITAL ORDER, PERFORMED IN ~~LOC~~ HOSPITAL LAB): SARS Coronavirus 2: NEGATIVE

## 2019-07-03 MED ORDER — ASPIRIN 325 MG PO TABS
325.0000 mg | ORAL_TABLET | Freq: Every day | ORAL | Status: DC
  Administered 2019-07-03: 325 mg via ORAL
  Filled 2019-07-03: qty 1

## 2019-07-03 MED ORDER — SODIUM CHLORIDE 0.9 % IV SOLN: INTRAVENOUS | Status: DC

## 2019-07-03 MED ORDER — ENOXAPARIN SODIUM 40 MG/0.4ML ~~LOC~~ SOLN
40.0000 mg | SUBCUTANEOUS | Status: DC
  Administered 2019-07-03: 40 mg via SUBCUTANEOUS
  Filled 2019-07-03: qty 0.4

## 2019-07-03 MED ORDER — STROKE: EARLY STAGES OF RECOVERY BOOK
Freq: Once | Status: AC
  Filled 2019-07-03: qty 1

## 2019-07-03 MED ORDER — SODIUM CHLORIDE 0.9 % IV BOLUS
1000.0000 mL | Freq: Once | INTRAVENOUS | Status: AC
  Administered 2019-07-03: 1000 mL via INTRAVENOUS

## 2019-07-03 MED ORDER — SENNOSIDES-DOCUSATE SODIUM 8.6-50 MG PO TABS: 1.0000 | ORAL_TABLET | Freq: Every evening | ORAL | Status: DC | PRN

## 2019-07-03 MED ORDER — ATORVASTATIN CALCIUM 40 MG PO TABS
40.0000 mg | ORAL_TABLET | Freq: Every day | ORAL | Status: DC
  Administered 2019-07-03: 40 mg via ORAL
  Filled 2019-07-03: qty 1

## 2019-07-03 MED ORDER — SODIUM CHLORIDE 0.9% FLUSH
3.0000 mL | Freq: Once | INTRAVENOUS | Status: AC
  Administered 2019-07-03: 3 mL via INTRAVENOUS

## 2019-07-03 NOTE — ED Triage Notes (Signed)
Pt presents to ED for aphasia, R sided leaning and weakness. LSW 12p today, sx noted at 1530. Recent UTI, h/o dementia.   EMS exam: R ataxia, R lean, aphasia  176/80 93% RA, 52bpm

## 2019-07-03 NOTE — Consult Note (Signed)
Requesting Physician: Dr. Darl Householder    Chief Complaint: Leaning to the right side, aphasia  History obtained from: Patient and Chart   HPI:                                                                                                                                       [name] Potter is a 84 y.o. male with past medical history significant for coronary artery disease, hypertension, hyperlipidemia, dementia presents to the emergency department for sudden onset difficulty with speech and leaning to the right side.  History obtained by EMS as family could not be reached.  Per EMS, patient had return from his [place] clinic visit this afternoon.  He was sitting in the chair and around 3:30 PM family noted that he was slumped over on the right side.  They called EMS and EMS felt he had mild aphasia as well as mild right-sided weakness and activated code stroke.  On arrival to [facility], ER-patient appeared confused but moving all 4 extremities equally.  NIH stroke scale 4. CT head showed no acute findings.  tPA was not administered as patient improving as well as mild nondisabling deficits.    Date last known well: 5 24-21 Time last known well: 12 PM/3:30 PM tPA Given: no, mild and non disabling deficits NIHSS: 4 Baseline MRS 2    Past Medical History:  Diagnosis Date  . 3-vessel CAD    small vessel dz on cath 02/2007; medical mgmt .  ETT at [facility] 01/23/11 was normal per pt report.  . Brain mass    Multiple, benign& very small, no surgery required [facility] [place]).  MRI's q19mo at [place] in [place].  . Colon polyp    (The only old record available was a flex sig from 03/1998, which showed sigmoid diverticulosis and internal hemorrhoids--no polyps(?hyperplastic; pt was told to repeat in 10 yrs per his report.)  . Erectile dysfunction   . History of osteomyelitis   . History of tobacco abuse quit 2007  . HTN (hypertension)   . Hyperlipidemia    Hx of intolerance to various statins  . Hypogonadism  male    topical replacement rx'd by his [place] cardiologist was not helpful per pt  . Hypothyroidism   . Myocardial infarction, subendocardial 02/2007   by enzymes  . Osteoarthritis    mainly left thumb    Past Surgical History:  Procedure Laterality Date  . CARDIAC CATHETERIZATION  02/2007   3 vessel CAD, normal EF, small infrarenal AAA, mild right RAS (30%)  . ESOPHAGOGASTRODUODENOSCOPY     erosive esophagitis with hiatal hernia  . INGUINAL HERNIA REPAIR  2009   right  . LARYNX SURGERY     vocal cord polyp removed x 2  . LUMBAR DISC SURGERY  30+ yrs ago   discectomy x 1    Family History  Problem Relation Age of Onset  . Diabetes  Mother   . Cancer Sister        lung cancer, smoker   Social History:  reports that he quit smoking about 12 years ago. His smoking use included cigarettes. He quit smokeless tobacco use about 8 years ago.  His smokeless tobacco use included chew. He reports that he does not drink alcohol or use drugs.  Allergies:  Allergies  Allergen Reactions  . Acetaminophen     Patient unable to explain but says the [place] told him not to take it anymore.    Medications:                                                                                                                        I reviewed home medications   ROS:                                                                                                                                     14 systems reviewed and negative except above   Examination:                                                                                                      General: Appears well-developed . Psych: Affect appropriate to situation Eyes: No scleral injection HENT: No OP obstrucion Head: Normocephalic.  Cardiovascular: Normal rate and regular rhythm.  Respiratory: Effort normal and breath sounds normal to anterior ascultation GI: Soft.  No distension. There is no tenderness.  Skin: WDI    Neurological  Examination Mental Status: Alert, oriented to himself.  Mild aphasia. Able to follow 3 step commands without difficulty. Cranial Nerves: II: Visual fields grossly normal,  III,IV, VI: ptosis not present, extra-ocular motions intact bilaterally, pupils equal, round, reactive to light and accommodation V,VII: smile symmetric, facial light touch sensation normal bilaterally VIII: hearing normal bilaterally IX,X: uvula rises symmetrically XI: bilateral shoulder shrug XII: midline tongue extension Motor: Right : Upper extremity   5/5    Left:  Upper extremity   5/5  Lower extremity   5/5     Lower extremity   5/5 Tone and bulk:normal tone throughout; no atrophy noted Sensory: Pinprick and light touch intact throughout, bilaterally Plantars: Right: downgoing   Left: downgoing Cerebellar: normal finger-to-nose, normal rapid alternating movements and normal heel-to-shin test      Lab Results: Basic Metabolic Panel: Recent Labs  Lab 07/03/19 1855  NA 138  K 4.1  CL 102  GLUCOSE 88  BUN 16  CREATININE 1.10    CBC: Recent Labs  Lab 07/03/19 1847 07/03/19 1855  WBC 6.3  --   NEUTROABS 3.8  --   HGB 12.8* 12.6*  HCT 38.9* 37.0*  MCV 96.0  --   PLT 212  --     Coagulation Studies: Recent Labs    07/03/19 1847  LABPROT 14.1  INR 1.1    Imaging: CT HEAD CODE STROKE WO CONTRAST  Result Date: 07/03/2019 CLINICAL DATA:  Code stroke. Neuro deficit, acute, stroke suspected. EXAM: CT HEAD WITHOUT CONTRAST TECHNIQUE: Contiguous axial images were obtained from the base of the skull through the vertex without intravenous contrast. COMPARISON:  None. FINDINGS: Brain: No acute infarct, acute intracranial hemorrhage, mass, or midline shift is identified. Asymmetric extra-axial CSF over the left cerebral convexity measures up to 8 mm in thickness in the frontal region with mild underlying gyral flattening but no other significant mass effect. The ventricles are normal in size.  There is a cavum septum pellucidum et vergae. Hypodensities in the cerebral white matter bilaterally are nonspecific but compatible with moderate chronic small vessel ischemic disease. Vascular: Calcified atherosclerosis at the skull base. No hyperdense vessel. Skull: No fracture or suspicious osseous lesion. Sinuses/Orbits: Paranasal sinuses and mastoid air cells are clear. Unremarkable orbits. Other: None. ASPECTS [facility] Stroke Program Early CT Score) - Ganglionic level infarction (caudate, lentiform nuclei, internal capsule, insula, M1-M3 cortex): 7 - Supraganglionic infarction (M4-M6 cortex): 3 Total score (0-10 with 10 being normal): 10 IMPRESSION: 1. No evidence of acute intracranial abnormality. 2. ASPECTS is 10. 3. Moderate chronic small vessel ischemic disease. 4. 8 mm thick low-density collection over the left cerebral convexity, likely a subdural hygroma. No significant mass effect. These results were communicated to Dr. Lorraine Lax at 7:04 pm on 07/03/2019 by text page via the [facility] messaging system. Electronically Signed   By: Logan Bores M.D.   On: 07/03/2019 19:04     ASSESSMENT AND PLAN    84 y.o. male with past medical history significant for coronary artery disease, hypertension, hyperlipidemia, dementia presents to the emergency department for sudden onset difficulty with speech and leaning to the right side.  Transient ischemic attack versus mild acute ischemic stroke versus seizure  Recommendations # MRI of the brain without contrast #Transthoracic Echo and carotid dopplers # EEG # Start patient on ASA 325mg  daily #Start or continue Atorvastatin 0 mg/other high intensity statin # BP goal: permissive HTN upto 220/120 mmHg ( 185/110 if patient has CHF, CKD) # HBAIC and Lipid profile # Telemetry monitoring # Frequent neuro checks # Stroke swallow screen  Please page stroke NP  Or  PA  Or MD from 8am -4 pm  as this patient from this time will be  followed by the stroke.   You can  look them up on [email or web address]  Password [facility]    Tarik Teixeira [facility] Pager Number [phone]

## 2019-07-03 NOTE — H&P (Signed)
History and Physical   [name] EGLESTON [id number] DOB: 24-Feb-1935 DOA: 07/03/2019  Referring MD/NP/PA: Dr. Darl Householder  PCP: [facility]   Outpatient Specialists: None  Patient coming from: Home  Chief Complaint: Altered mental status and a code stroke  HPI: [name] Potter is a 84 y.o. male with medical history significant of coronary artery disease status post coronary artery bypass graft, history of brain mass, hypertension, hyperlipidemia, tobacco abuse, hypogonadism, hypothyroidism, osteoarthritis and previous osteomyelitis, diabetes, dementia and residual right-sided weakness from previous CVA who presented with altered mental status.  Patient was last seen normal around noon.  He was sitting in the chair and around 330 was found to be slumped over to the right side.  Patient had some slurred speech.  9 1 was called and EMS arrived with a code stroke initiated.  It was reported that he had NIH scale of 4.  Patient was recently treated for UTI and he has completed the course of treatment.  In the ER he appears to be confused.  Communicating bruits still mildly lethargic.  Initial head CT was negative.  Neurology consulted and patient being admitted for work-up of acute CVA..  ED Course: Temperature is 99 blood pressure 118/65 pulse 62 occasionally down to 40s respirate of 17 oxygen sat 82% on room air.  Urinalysis essentially negative.  Chest x-ray is negative head CT without contrast showed no acute intracranial abnormalities.  He has 8 mm subdural hygroma.  Patient being admitted for CVA work-up.  Review of Systems: As per HPI otherwise 10 point review of systems negative.    Past Medical History:  Diagnosis Date  . 3-vessel CAD    small vessel dz on cath 02/2007; medical mgmt .  ETT at [facility] 01/23/11 was normal per pt report.  . Brain mass    Multiple, benign& very small, no surgery required [facility]).  MRI's q57mo at [place] in [place].  . Colon polyp    (The only old  record available was a flex sig from 03/1998, which showed sigmoid diverticulosis and internal hemorrhoids--no polyps(?hyperplastic; pt was told to repeat in 10 yrs per his report.)  . Erectile dysfunction   . History of osteomyelitis   . History of tobacco abuse quit 2007  . HTN (hypertension)   . Hyperlipidemia    Hx of intolerance to various statins  . Hypogonadism male    topical replacement rx'd by his [place] cardiologist was not helpful per pt  . Hypothyroidism   . Myocardial infarction, subendocardial 02/2007   by enzymes  . Osteoarthritis    mainly left thumb    Past Surgical History:  Procedure Laterality Date  . CARDIAC CATHETERIZATION  02/2007   3 vessel CAD, normal EF, small infrarenal AAA, mild right RAS (30%)  . ESOPHAGOGASTRODUODENOSCOPY     erosive esophagitis with hiatal hernia  . INGUINAL HERNIA REPAIR  2009   right  . LARYNX SURGERY     vocal cord polyp removed x 2  . LUMBAR DISC SURGERY  30+ yrs ago   discectomy x 1     reports that he quit smoking about 12 years ago. His smoking use included cigarettes. He quit smokeless tobacco use about 8 years ago.  His smokeless tobacco use included chew. He reports that he does not drink alcohol or use drugs.  Allergies  Allergen Reactions  . Acetaminophen     Patient unable to explain but says the [place] told him not to take it anymore.  07/03/19:  Wife says pt has taken med with no issues.    Family History  Problem Relation Age of Onset  . Diabetes Mother   . Cancer Sister        lung cancer, smoker     Prior to Admission medications   Medication Sig Start Date End Date Taking? Authorizing Provider  aspirin 81 MG tablet Take 81 mg by mouth 2 (two) times daily.    [provider]  atorvastatin (LIPITOR) 80 MG tablet Take 80 mg by mouth daily.    [provider]  cyanocobalamin 500 MCG tablet Take 1,000 mcg by mouth daily.    [provider]  donepezil (ARICEPT) 10 MG tablet Take 10 mg by  mouth at bedtime.    [provider]  ipratropium (ATROVENT) 0.03 % nasal spray 2 sprays each nostril tid prn runny nose 01/16/13   McGowen, Adrian Blackwater, MD  levETIRAcetam (KEPPRA) 500 MG tablet Take 500 mg by mouth 2 (two) times daily.    [provider]  levothyroxine (SYNTHROID, LEVOTHROID) 137 MCG tablet Take 137 mcg by mouth daily.    [provider]  lisinopril-hydrochlorothiazide (PRINZIDE,ZESTORETIC) 20-12.5 MG per tablet Take 1 tablet by mouth daily.    [provider]  metoprolol tartrate (LOPRESSOR) 25 MG tablet Take 12.5 mg by mouth 2 (two) times daily.    [provider]  ofloxacin (FLOXIN) 0.3 % otic solution Place 10 drops into both ears daily. Patient not taking: Reported on 01/01/2014 05/18/13   Tammi Sou, MD  Omega-3 Fatty Acids (FISH OIL) 1000 MG CAPS Take 2 capsules by mouth 2 (two) times daily.     [provider]  predniSONE (DELTASONE) 20 MG tablet 2 tabs po qd x 2d, then 1 tab po qd x 2d, then 1/2 tab po qd x 2d 01/01/14   Tammi Sou, MD    Physical Exam: Vitals:   07/03/19 2145 07/03/19 2245 07/03/19 2300 07/03/19 2315  BP: 125/64 (!) 170/90 (!) 189/65 (!) 124/58  Pulse: (!) 47 61 (!) 55 (!) 29  Resp: 13 14 14 11   Temp:      TempSrc:      SpO2: 100% 100% 100% (!) 82%  Weight:          Constitutional: Confused but no acute distress laying in bed Vitals:   07/03/19 2145 07/03/19 2245 07/03/19 2300 07/03/19 2315  BP: 125/64 (!) 170/90 (!) 189/65 (!) 124/58  Pulse: (!) 47 61 (!) 55 (!) 29  Resp: 13 14 14 11   Temp:      TempSrc:      SpO2: 100% 100% 100% (!) 82%  Weight:       Eyes: PERRL, lids and conjunctivae normal ENMT: Mucous membranes are moist. Posterior pharynx clear of any exudate or lesions.Normal dentition.  Neck: normal, supple, no masses, no thyromegaly Respiratory: clear to auscultation bilaterally, no wheezing, no crackles. Normal respiratory effort. No accessory muscle use.    Cardiovascular: Regular rate and rhythm, no murmurs / rubs / gallops. No extremity edema. 2+ pedal pulses. No carotid bruits.  Abdomen: no tenderness, no masses palpated. No hepatosplenomegaly. Bowel sounds positive.  Musculoskeletal: no clubbing / cyanosis. No joint deformity upper and lower extremities. Good ROM, no contractures. Normal muscle tone.  Skin: no rashes, lesions, ulcers. No induration Neurologic: CN 2-12 grossly intact. Sensation intact, DTR normal. Strength 5/5 in all 4.  Psychiatric: Confused, mild altered mental status.   Labs on Admission: I have personally reviewed following  labs and imaging studies  CBC: Recent Labs  Lab 07/03/19 1847 07/03/19 1855  WBC 6.3  --   NEUTROABS 3.8  --   HGB 12.8* 12.6*  HCT 38.9* 37.0*  MCV 96.0  --   PLT 212  --    Basic Metabolic Panel: Recent Labs  Lab 07/03/19 1847 07/03/19 1855  NA 138 138  K 4.2 4.1  CL 104 102  CO2 26  --   GLUCOSE 95 88  BUN 15 16  CREATININE 1.05 1.10  CALCIUM 9.4  --    GFR: CrCl cannot be calculated (Unknown ideal weight.). Liver Function Tests: Recent Labs  Lab 07/03/19 1847  AST 28  ALT 30  ALKPHOS 69  BILITOT 0.6  PROT 6.4*  ALBUMIN 3.7   No results for input(s): LIPASE, AMYLASE in the last 168 hours. No results for input(s): AMMONIA in the last 168 hours. Coagulation Profile: Recent Labs  Lab 07/03/19 1847  INR 1.1   Cardiac Enzymes: No results for input(s): CKTOTAL, CKMB, CKMBINDEX, TROPONINI in the last 168 hours. BNP (last 3 results) No results for input(s): PROBNP in the last 8760 hours. HbA1C: No results for input(s): HGBA1C in the last 72 hours. CBG: Recent Labs  Lab 07/03/19 1844  GLUCAP 86   Lipid Profile: No results for input(s): CHOL, HDL, LDLCALC, TRIG, CHOLHDL, LDLDIRECT in the last 72 hours. Thyroid Function Tests: No results for input(s): TSH, T4TOTAL, FREET4, T3FREE, THYROIDAB in the last 72 hours. Anemia Panel: No results for input(s):  VITAMINB12, FOLATE, FERRITIN, TIBC, IRON, RETICCTPCT in the last 72 hours. Urine analysis:    Component Value Date/Time   COLORURINE YELLOW 07/03/2019 1900   APPEARANCEUR HAZY (A) 07/03/2019 1900   LABSPEC 1.006 07/03/2019 1900   PHURINE 7.0 07/03/2019 1900   GLUCOSEU NEGATIVE 07/03/2019 1900   HGBUR NEGATIVE 07/03/2019 1900   BILIRUBINUR NEGATIVE 07/03/2019 1900   BILIRUBINUR n 03/12/2011 1426   KETONESUR NEGATIVE 07/03/2019 1900   PROTEINUR NEGATIVE 07/03/2019 1900   UROBILINOGEN 0.2 03/12/2011 1426   UROBILINOGEN 1.0 02/18/2007 0457   NITRITE NEGATIVE 07/03/2019 1900   LEUKOCYTESUR NEGATIVE 07/03/2019 1900   Sepsis Labs: @LABRCNTIP (procalcitonin:4,lacticidven:4) ) Recent Results (from the past 240 hour(s))  SARS Coronavirus 2 by RT PCR (hospital order, performed in [facility] hospital lab) Nasopharyngeal Urine, Catheterized     Status: None   Collection Time: 07/03/19  7:27 PM   Specimen: Urine, Catheterized; Nasopharyngeal  Result Value Ref Range Status   SARS Coronavirus 2 NEGATIVE NEGATIVE Final    Comment: (NOTE) SARS-CoV-2 target nucleic acids are NOT DETECTED. The SARS-CoV-2 RNA is generally detectable in upper and lower respiratory specimens during the acute phase of infection. The lowest concentration of SARS-CoV-2 viral copies this assay can detect is 250 copies / mL. A negative result does not preclude SARS-CoV-2 infection and should not be used as the sole basis for treatment or other patient management decisions.  A negative result may occur with improper specimen collection / handling, submission of specimen other than nasopharyngeal swab, presence of viral mutation(s) within the areas targeted by this assay, and inadequate number of viral copies (<250 copies / mL). A negative result must be combined with clinical observations, patient history, and epidemiological information. Fact Sheet for Patients:   StrictlyIdeas.no Fact Sheet  for Healthcare Providers: BankingDealers.co.za This test is not yet approved or cleared  by the [place] FDA and has been authorized for detection and/or diagnosis of SARS-CoV-2 by FDA under an Emergency Use Authorization (EUA).  This EUA will remain in effect (meaning this test can be used) for the duration of the COVID-19 declaration under Section 564(b)(1) of the Act, 21 U.S.C. section 360bbb-3(b)(1), unless the authorization is terminated or revoked sooner. Performed at [facility], [place] [address], [place], [place] [address]      Radiological Exams on Admission: MR BRAIN WO CONTRAST  Result Date: 07/03/2019 CLINICAL DATA:  Stroke follow-up EXAM: MRI HEAD WITHOUT CONTRAST TECHNIQUE: Multiplanar, multiecho pulse sequences of the brain and surrounding structures were obtained without intravenous contrast. COMPARISON:  Head CT 07/03/2019 FINDINGS: Brain: No acute infarct, acute hemorrhage or extra-axial collection. Early confluent hyperintense T2-weighted signal of the periventricular and deep white matter, most commonly due to chronic ischemic microangiopathy. There is generalized atrophy without lobar predilection. No chronic microhemorrhage. Normal midline structures. Vascular: Normal flow voids. Skull and upper cervical spine: Normal marrow signal. Sinuses/Orbits: Negative. Other: None IMPRESSION: 1. No acute intracranial abnormality. 2. Generalized atrophy and chronic ischemic microangiopathy. Electronically Signed   By: Ulyses Jarred M.D.   On: 07/03/2019 22:49   DG Chest Port 1 View  Result Date: 07/03/2019 CLINICAL DATA:  Altered mental status EXAM: PORTABLE CHEST 1 VIEW COMPARISON:  02/18/2007 FINDINGS: The heart size and mediastinal contours are within normal limits. Both lungs are clear. The visualized skeletal structures are unremarkable. Aortic atherosclerosis. IMPRESSION: No active disease. Electronically Signed   By: Donavan Foil M.D.   On:  07/03/2019 19:13   CT HEAD CODE STROKE WO CONTRAST  Result Date: 07/03/2019 CLINICAL DATA:  Code stroke. Neuro deficit, acute, stroke suspected. EXAM: CT HEAD WITHOUT CONTRAST TECHNIQUE: Contiguous axial images were obtained from the base of the skull through the vertex without intravenous contrast. COMPARISON:  None. FINDINGS: Brain: No acute infarct, acute intracranial hemorrhage, mass, or midline shift is identified. Asymmetric extra-axial CSF over the left cerebral convexity measures up to 8 mm in thickness in the frontal region with mild underlying gyral flattening but no other significant mass effect. The ventricles are normal in size. There is a cavum septum pellucidum et vergae. Hypodensities in the cerebral white matter bilaterally are nonspecific but compatible with moderate chronic small vessel ischemic disease. Vascular: Calcified atherosclerosis at the skull base. No hyperdense vessel. Skull: No fracture or suspicious osseous lesion. Sinuses/Orbits: Paranasal sinuses and mastoid air cells are clear. Unremarkable orbits. Other: None. ASPECTS [facility] Stroke Program Early CT Score) - Ganglionic level infarction (caudate, lentiform nuclei, internal capsule, insula, M1-M3 cortex): 7 - Supraganglionic infarction (M4-M6 cortex): 3 Total score (0-10 with 10 being normal): 10 IMPRESSION: 1. No evidence of acute intracranial abnormality. 2. ASPECTS is 10. 3. Moderate chronic small vessel ischemic disease. 4. 8 mm thick low-density collection over the left cerebral convexity, likely a subdural hygroma. No significant mass effect. These results were communicated to Dr. Lorraine Lax at 7:04 pm on 07/03/2019 by text page via the [facility] messaging system. Electronically Signed   By: Logan Bores M.D.   On: 07/03/2019 19:04    EKG: Independently reviewed.  Normal sinus rhythm with no significant ST changes  Assessment/Plan Principal Problem:   Acute cerebrovascular accident (CVA) ([facility]) Active Problems:   Brain  tumor ([place])   Dementia ([place])     #1  Possible CVA: Head CT without contrast is negative.  MRI currently done results pending.  Patient will be admitted and continued on work-up for CVA.  Neurology already consulted as per the patient.  We will continue close follow until resolution of symptoms.  PT OT  consultation follow-up with PCP and neurology at discharge.  #2 history of dementia: Appears to be at baseline.  We will continue close monitoring.    #3 hypertension: Confirm on resume continue home regimen.  #4 coronary artery disease: Appears stable.  Continue monitoring  #5 hyperlipidemia: Statin will be continued with once patient passes swallow evaluation.  DVT prophylaxis: Lovenox Code Status: Full code Family Communication: No family at bedside Disposition Plan: Home Consults called: Dr. Lorraine Lax, neurology Admission status: Observation  Severity of Illness: The appropriate patient status for this patient is OBSERVATION. Observation status is judged to be reasonable and necessary in order to provide the required intensity of service to ensure the patient's safety. The patient's presenting symptoms, physical exam findings, and initial radiographic and laboratory data in the context of their medical condition is felt to place them at decreased risk for further clinical deterioration. Furthermore, it is anticipated that the patient will be medically stable for discharge from the hospital within 2 midnights of admission. The following factors support the patient status of observation.   " The patient's presenting symptoms include weakness. " The physical exam findings include no focal weakness. " The initial radiographic and laboratory data are negative.     Barbette Merino MD [facility] Pager [phone]  If 7PM-7AM, please contact night-coverage [email or web address] Password [facility]  07/03/2019, 11:50 PM

## 2019-07-03 NOTE — ED Provider Notes (Signed)
[facility] EMERGENCY DEPARTMENT Provider Note   CSN: [id number] Arrival date & time: 07/03/19  1841     History Chief Complaint  Patient presents with  . Code Stroke    GLYNDON RAILE is a 84 y.o. male history of CAD, hypertension, MI, diabetes, dementia here presenting with trouble speaking and right-sided weakness. Patient's last normal was around noon today.  Family noticed that he was sitting in the chair around 330 and was slumped over to on the right side.  He had some right-sided weakness then and seemed to have a slurring his speech so code stroke was activated by EMS. Patient had initial NIH scale of 4.  He was seen in the finish a course of antibiotics for UTI.  The history is provided by the patient, the EMS personnel and the spouse.       Past Medical History:  Diagnosis Date  . 3-vessel CAD    small vessel dz on cath 02/2007; medical mgmt .  ETT at [facility] 01/23/11 was normal per pt report.  . Brain mass    Multiple, benign& very small, no surgery required [facility]).  MRI's q63mo at [place] in [place].  . Colon polyp    (The only old record available was a flex sig from 03/1998, which showed sigmoid diverticulosis and internal hemorrhoids--no polyps(?hyperplastic; pt was told to repeat in 10 yrs per his report.)  . Erectile dysfunction   . History of osteomyelitis   . History of tobacco abuse quit 2007  . HTN (hypertension)   . Hyperlipidemia    Hx of intolerance to various statins  . Hypogonadism male    topical replacement rx'd by his [place] cardiologist was not helpful per pt  . Hypothyroidism   . Myocardial infarction, subendocardial 02/2007   by enzymes  . Osteoarthritis    mainly left thumb    Patient Active Problem List   Diagnosis Date Noted  . AMS (altered mental status) 07/04/2019  . Acute cerebrovascular accident (CVA) ([place]) 07/03/2019  . Acute otitis externa 05/18/2013  . Chest wall contusion 09/08/2012  . Brain tumor ([place])  09/08/2012  . Dementia ([place]) 09/08/2012  . Acute bronchitis 01/27/2012  . Prostatitis, acute 03/12/2011    Past Surgical History:  Procedure Laterality Date  . CARDIAC CATHETERIZATION  02/2007   3 vessel CAD, normal EF, small infrarenal AAA, mild right RAS (30%)  . ESOPHAGOGASTRODUODENOSCOPY     erosive esophagitis with hiatal hernia  . INGUINAL HERNIA REPAIR  2009   right  . LARYNX SURGERY     vocal cord polyp removed x 2  . LUMBAR DISC SURGERY  30+ yrs ago   discectomy x 1       Family History  Problem Relation Age of Onset  . Diabetes Mother   . Cancer Sister        lung cancer, smoker    Social History   Tobacco Use  . Smoking status: Former Smoker    Types: Cigarettes    Quit date: 02/10/2007    Years since quitting: 12.4  . Smokeless tobacco: Former Systems developer    Types: Chew    Quit date: 02/10/2011  . Tobacco comment: pt began chewing tobacco after stopping cigs in 2009, quit chew in 02/2011  Substance Use Topics  . Alcohol use: No  . Drug use: No    Home Medications Prior to Admission medications   Medication Sig Start Date End Date Taking? Authorizing Provider  aspirin 81 MG tablet  Take 81 mg by mouth daily.    Yes [provider]  atorvastatin (LIPITOR) 80 MG tablet Take 80 mg by mouth daily.   Yes [provider]  cefUROXime (CEFTIN) 500 MG tablet Take 500 mg by mouth 2 (two) times daily with a meal. For 21 days.   Yes [provider]  Cholecalciferol 25 MCG (1000 UT) tablet Take 1,000 Units by mouth daily.   Yes [provider]  cyanocobalamin 500 MCG tablet Take 500 mcg by mouth daily.    Yes [provider]  levETIRAcetam (KEPPRA) 500 MG tablet Take 500 mg by mouth 2 (two) times daily.   Yes [provider]  levothyroxine (SYNTHROID, LEVOTHROID) 137 MCG tablet Take 137 mcg by mouth daily.   Yes [provider]  lipase/protease/amylase (CREON) 12000-38000 units CPEP capsule Take 12,000 Units by  mouth 3 (three) times daily with meals.   Yes [provider]  lisinopril-hydrochlorothiazide (PRINZIDE,ZESTORETIC) 20-12.5 MG per tablet Take 1 tablet by mouth daily.   Yes [provider]  loratadine (CLARITIN) 10 MG tablet Take 10 mg by mouth daily as needed for allergies.    Yes [provider]  Omega-3 Fatty Acids (FISH OIL) 1000 MG CAPS Take 2 capsules by mouth 2 (two) times daily.    Yes [provider]  Probiotic Product (PROBIOTIC PO) Take 1 tablet by mouth daily.   Yes [provider]  propranolol ER (INDERAL LA) 60 MG 24 hr capsule Take 60 mg by mouth daily.   Yes [provider]  tamsulosin (FLOMAX) 0.4 MG CAPS capsule Take 0.4 mg by mouth daily.    Yes [provider]    Allergies    Acetaminophen  Review of Systems   Review of Systems  Neurological: Positive for speech difficulty and weakness.  All other systems reviewed and are negative.   Physical Exam Updated Vital Signs BP 122/80 (BP Location: Right Arm)   Pulse (!) 51   Temp 98.7 F (37.1 C) (Oral)   Resp 16   Wt 60.9 kg   SpO2 99%   BMI 19.26 kg/m   Physical Exam Vitals and nursing note reviewed.  HENT:     Head: Normocephalic.     Nose: Nose normal.     Mouth/Throat:     Mouth: Mucous membranes are moist.  Eyes:     Extraocular Movements: Extraocular movements intact.     Pupils: Pupils are equal, round, and reactive to light.  Cardiovascular:     Rate and Rhythm: Normal rate and regular rhythm.     Pulses: Normal pulses.     Heart sounds: Normal heart sounds.  Pulmonary:     Effort: Pulmonary effort is normal.     Breath sounds: Normal breath sounds.  Abdominal:     General: Abdomen is flat.     Palpations: Abdomen is soft.  Musculoskeletal:        General: Normal range of motion.     Cervical back: Normal range of motion.  Skin:    General: Skin is warm.  Neurological:     Mental Status: He is alert.     Comments: ANO x 2.   No obvious facial droop.  Patient has 4 or 5 strength on the right arm and 5 out of 5 on the left arm.  He has 5 out of 5 strength bilateral lower extremities.  Questionable right pronator drift.    Psychiatric:        Mood and  Affect: Mood normal.        Behavior: Behavior normal.     ED Results / Procedures / Treatments   Labs (all labs ordered are listed, but only abnormal results are displayed) Labs Reviewed  URINE CULTURE - Abnormal; Notable for the following components:      Result Value   Culture 40,000 COLONIES/mL PSEUDOMONAS AERUGINOSA (*)    Organism ID, Bacteria PSEUDOMONAS AERUGINOSA (*)    All other components within normal limits  CBC - Abnormal; Notable for the following components:   RBC 4.05 (*)    Hemoglobin 12.8 (*)    HCT 38.9 (*)    All other components within normal limits  COMPREHENSIVE METABOLIC PANEL - Abnormal; Notable for the following components:   Total Protein 6.4 (*)    All other components within normal limits  URINALYSIS, ROUTINE W REFLEX MICROSCOPIC - Abnormal; Notable for the following components:   APPearance HAZY (*)    All other components within normal limits  I-STAT CHEM 8, ED - Abnormal; Notable for the following components:   Hemoglobin 12.6 (*)    HCT 37.0 (*)    All other components within normal limits  SARS CORONAVIRUS 2 BY RT PCR (HOSPITAL ORDER, [facility])  PROTIME-INR  APTT  DIFFERENTIAL  CBG MONITORING, ED  CBG MONITORING, ED    EKG EKG Interpretation  Date/Time:  Monday Jul 03 2019 19:26:48 EDT Ventricular Rate:  56 PR Interval:    QRS Duration: 107 QT Interval:  464 QTC Calculation: 448 R Axis:   54 Text Interpretation: Sinus bradycardia Otherwise within normal limits When compared with ECG of 04/07/2007, No significant change was found Confirmed by Delora Fuel ([id number]) on 07/04/2019 5:09:50 AM   Radiology MR BRAIN WO CONTRAST  Result Date: 07/03/2019 CLINICAL DATA:  Stroke follow-up  EXAM: MRI HEAD WITHOUT CONTRAST TECHNIQUE: Multiplanar, multiecho pulse sequences of the brain and surrounding structures were obtained without intravenous contrast. COMPARISON:  Head CT 07/03/2019 FINDINGS: Brain: No acute infarct, acute hemorrhage or extra-axial collection. Early confluent hyperintense T2-weighted signal of the periventricular and deep white matter, most commonly due to chronic ischemic microangiopathy. There is generalized atrophy without lobar predilection. No chronic microhemorrhage. Normal midline structures. Vascular: Normal flow voids. Skull and upper cervical spine: Normal marrow signal. Sinuses/Orbits: Negative. Other: None IMPRESSION: 1. No acute intracranial abnormality. 2. Generalized atrophy and chronic ischemic microangiopathy. Electronically Signed   By: Ulyses Jarred M.D.   On: 07/03/2019 22:49   DG Chest Port 1 View  Result Date: 07/03/2019 CLINICAL DATA:  Altered mental status EXAM: PORTABLE CHEST 1 VIEW COMPARISON:  02/18/2007 FINDINGS: The heart size and mediastinal contours are within normal limits. Both lungs are clear. The visualized skeletal structures are unremarkable. Aortic atherosclerosis. IMPRESSION: No active disease. Electronically Signed   By: Donavan Foil M.D.   On: 07/03/2019 19:13   EEG adult  Result Date: 07/04/2019 Lora Havens, MD     07/04/2019  1:03 PM Patient Name: FARON ESTEPA MRN: [id number] Epilepsy Attending: Lora Havens Referring Physician/Provider: Dr. Antony Contras Date: 07/04/2019 Duration: 28.45 minutes Patient history: 84 year old male who presented with sudden onset speech disturbance and leaning to the right.  EEG evaluate for seizures. Level of alertness: Awake AEDs during EEG study: Ke[place]ra Technical aspects: This EEG study was done with scalp electrodes positioned according to the 10-20 International system of electrode placement. Electrical activity was acquired at a sampling rate of 500Hz  and reviewed with a high frequency  filter of 70Hz  and a low frequency filter of 1Hz . EEG data were recorded continuously and digitally stored. Description: No clear posterior dominant rhythm was seen.  EEG showed continuous generalized 5 to 6 Hz theta slowing. Hyperventilation and photic stimulation were not performed.   ABNORMALITY -Continuous slow, generalized IMPRESSION: This study is suggestive of mild to moderate diffuse encephalopathy, nonspecific etiology.  No seizures or epileptiform discharges were seen throughout the recording. Lora Havens   ECHOCARDIOGRAM COMPLETE  Result Date: 07/04/2019    ECHOCARDIOGRAM REPORT   Patient Name:   ADARRYLL LARRIMORE Date of Exam: 07/04/2019 Medical Rec #:  [id number]        Height:       70.0 in Accession #:    [id number]       Weight:       134.3 lb Date of Birth:  [birth year]       BSA:          1.762 m Patient Age:    21 years         BP:           167/67 mmHg Patient Gender: M                HR:           43 bpm. Exam Location:  Inpatient Procedure: 2D Echo Indications:    Stroke 434.91 / I163.9  History:        Patient has no prior history of Echocardiogram examinations. No                 prior cardiac history.  Sonographer:    Vikki Ports Turrentine Referring Phys: [id number] Elwyn Reach  Sonographer Comments: Image acquisition challenging due to patient body habitus. Extremely difficult study due to small rib spacing and poor patient compliance. IMPRESSIONS  1. Left ventricular ejection fraction, by estimation, is 55 to 60%. The left ventricle has normal function. The left ventricle has no regional wall motion abnormalities. There is mild left ventricular hypertrophy. Left ventricular diastolic parameters are consistent with Grade II diastolic dysfunction (pseudonormalization).  2. Right ventricular systolic function is normal. The right ventricular size is normal. Tricuspid regurgitation signal is inadequate for assessing PA pressure.  3. The mitral valve is normal in structure. No evidence of  mitral valve regurgitation. No evidence of mitral stenosis.  4. The aortic valve was not well visualized. Aortic valve regurgitation is not visualized. No aortic stenosis is present.  5. The inferior vena cava is normal in size with greater than 50% respiratory variability, suggesting right atrial pressure of 3 mmHg. FINDINGS  Left Ventricle: Left ventricular ejection fraction, by estimation, is 55 to 60%. The left ventricle has normal function. The left ventricle has no regional wall motion abnormalities. The left ventricular internal cavity size was normal in size. There is  mild left ventricular hypertrophy. Left ventricular diastolic parameters are consistent with Grade II diastolic dysfunction (pseudonormalization). Right Ventricle: The right ventricular size is normal. No increase in right ventricular wall thickness. Right ventricular systolic function is normal. Tricuspid regurgitation signal is inadequate for assessing PA pressure. Left Atrium: Left atrial size was normal in size. Right Atrium: Right atrial size was normal in size. Pericardium: There is no evidence of pericardial effusion. Mitral Valve: The mitral valve is normal in structure. No evidence of mitral valve regurgitation. No evidence of mitral valve stenosis. Tricuspid Valve: The tricuspid valve is normal in structure. Tricuspid valve regurgitation is not demonstrated. Aortic Valve:  The aortic valve was not well visualized. Aortic valve regurgitation is not visualized. No aortic stenosis is present. Pulmonic Valve: The pulmonic valve was normal in structure. Pulmonic valve regurgitation is not visualized. Aorta: The aortic root is normal in size and structure. Venous: The inferior vena cava is normal in size with greater than 50% respiratory variability, suggesting right atrial pressure of 3 mmHg. IAS/Shunts: No atrial level shunt detected by color flow Doppler.  LEFT VENTRICLE PLAX 2D LVIDd:         4.57 cm  Diastology LVIDs:         3.40 cm   LV e' medial: 6.83 cm/s LV PW:         1.00 cm LV IVS:        0.97 cm LVOT diam:     1.90 cm LV SV:         62 LV SV Index:   35 LVOT Area:     2.84 cm  RIGHT VENTRICLE RV S prime:     12.20 cm/s TAPSE (M-mode): 2.7 cm LEFT ATRIUM         Index LA diam:    3.00 cm 1.70 cm/m  AORTIC VALVE LVOT Vmax:   82.00 cm/s LVOT Vmean:  53.700 cm/s LVOT VTI:    0.217 m  AORTA Ao Root diam: 3.50 cm  SHUNTS Systemic VTI:  0.22 m Systemic Diam: 1.90 cm Loralie Champagne MD Electronically signed by Loralie Champagne MD Signature Date/Time: 07/04/2019/10:13:29 AM    Final    CT HEAD CODE STROKE WO CONTRAST  Result Date: 07/03/2019 CLINICAL DATA:  Code stroke. Neuro deficit, acute, stroke suspected. EXAM: CT HEAD WITHOUT CONTRAST TECHNIQUE: Contiguous axial images were obtained from the base of the skull through the vertex without intravenous contrast. COMPARISON:  None. FINDINGS: Brain: No acute infarct, acute intracranial hemorrhage, mass, or midline shift is identified. Asymmetric extra-axial CSF over the left cerebral convexity measures up to 8 mm in thickness in the frontal region with mild underlying gyral flattening but no other significant mass effect. The ventricles are normal in size. There is a cavum septum pellucidum et vergae. Hypodensities in the cerebral white matter bilaterally are nonspecific but compatible with moderate chronic small vessel ischemic disease. Vascular: Calcified atherosclerosis at the skull base. No hyperdense vessel. Skull: No fracture or suspicious osseous lesion. Sinuses/Orbits: Paranasal sinuses and mastoid air cells are clear. Unremarkable orbits. Other: None. ASPECTS [facility] Stroke Program Early CT Score) - Ganglionic level infarction (caudate, lentiform nuclei, internal capsule, insula, M1-M3 cortex): 7 - Supraganglionic infarction (M4-M6 cortex): 3 Total score (0-10 with 10 being normal): 10 IMPRESSION: 1. No evidence of acute intracranial abnormality. 2. ASPECTS is 10. 3. Moderate chronic small  vessel ischemic disease. 4. 8 mm thick low-density collection over the left cerebral convexity, likely a subdural hygroma. No significant mass effect. These results were communicated to Dr. Lorraine Lax at 7:04 pm on 07/03/2019 by text page via the [facility] messaging system. Electronically Signed   By: Logan Bores M.D.   On: 07/03/2019 19:04   VAS US CAROTID (at [facility] and [facility] only)  Result Date: 07/04/2019 Carotid Arterial Duplex Study Indications:       CVA. Risk Factors:      Hypertension, hyperlipidemia. Comparison Study:  no prior Performing Technologist: Abram Sander RVS  Examination Guidelines: A complete evaluation includes B-mode imaging, spectral Doppler, color Doppler, and power Doppler as needed of all accessible portions of each vessel. Bilateral testing is considered an integral part of a  complete examination. Limited examinations for reoccurring indications may be performed as noted.  Right Carotid Findings: +----------+--------+--------+--------+------------------+--------+           PSV cm/sEDV cm/sStenosisPlaque DescriptionComments +----------+--------+--------+--------+------------------+--------+ CCA Prox  84      9               heterogenous               +----------+--------+--------+--------+------------------+--------+ CCA Distal73      14              heterogenous               +----------+--------+--------+--------+------------------+--------+ ICA Prox  61      14      1-39%   heterogenous               +----------+--------+--------+--------+------------------+--------+ ICA Distal79      13                                         +----------+--------+--------+--------+------------------+--------+ ECA       137                                                +----------+--------+--------+--------+------------------+--------+ +----------+--------+-------+--------+-------------------+           PSV cm/sEDV cmsDescribeArm Pressure (mmHG)  +----------+--------+-------+--------+-------------------+ [id number]                                        +----------+--------+-------+--------+-------------------+ +---------+--------+--+--------+--+---------+ VertebralPSV cm/s45EDV cm/s10Antegrade +---------+--------+--+--------+--+---------+  Left Carotid Findings: +----------+--------+--------+--------+------------------+--------+           PSV cm/sEDV cm/sStenosisPlaque DescriptionComments +----------+--------+--------+--------+------------------+--------+ CCA Prox  93      13              heterogenous               +----------+--------+--------+--------+------------------+--------+ CCA Distal75      9               heterogenous               +----------+--------+--------+--------+------------------+--------+ ICA Prox  73      15      1-39%   heterogenous               +----------+--------+--------+--------+------------------+--------+ ICA Distal74      18                                         +----------+--------+--------+--------+------------------+--------+ ECA       106                                                +----------+--------+--------+--------+------------------+--------+ +----------+--------+--------+--------+-------------------+           PSV cm/sEDV cm/sDescribeArm Pressure (mmHG) +----------+--------+--------+--------+-------------------+ [id number]                                         +----------+--------+--------+--------+-------------------+ +---------+--------+--+--------+--+---------+  VertebralPSV cm/s49EDV cm/s12Antegrade +---------+--------+--+--------+--+---------+   Summary: Right Carotid: Velocities in the right ICA are consistent with a 1-39% stenosis. Left Carotid: Velocities in the left ICA are consistent with a 1-39% stenosis. Vertebrals: Bilateral vertebral arteries demonstrate antegrade flow. *See table(s) above for measurements and  observations.  Electronically signed by Antony Contras MD on 07/04/2019 at 11:25:06 AM.    Final     Procedures Procedures (including critical care time)  Medications Ordered in ED Medications  sodium chloride flush (NS) 0.9 % injection 3 mL (3 mLs Intravenous Given 07/03/19 1938)  sodium chloride 0.9 % bolus 1,000 mL (0 mLs Intravenous Stopped 07/03/19 2318)   stroke: mapping our early stages of recovery book ( Does not apply Given 07/04/19 1717)    ED Course  I have reviewed the triage vital signs and the nursing notes.  Pertinent labs & imaging results that were available during my care of the patient were reviewed by me and considered in my medical decision making (see chart for details).    MDM Rules/Calculators/A&P                      ASHTIAN BOHNING is a 84 y.o. male here presenting with right-sided weakness and slurred speech.  Does have multiple risk factors for stroke.  Code stroke was activated by EMS but his NIH scale is only 4 so no TPA was given.  His symptoms have been improving.  He is also outside of the TPA window.  Will get CBC, CMP, urinalysis, chest x-ray, CT head.  8:14 PM CT showed chronic hygroma.  Labs and urinalysis unremarkable .  Neurology recommend admission. Will admit for TIA/stroke workup.   Final Clinical Impression(s) / ED Diagnoses Final diagnoses:  None    Rx / DC Orders ED Discharge Orders         Ordered    Increase activity slowly     07/04/19 1626    Diet - low sodium heart healthy     07/04/19 1626           Drenda Freeze, MD 07/05/19 1453

## 2019-07-04 ENCOUNTER — Inpatient Hospital Stay (HOSPITAL_BASED_OUTPATIENT_CLINIC_OR_DEPARTMENT_OTHER): Payer: Medicare HMO

## 2019-07-04 ENCOUNTER — Inpatient Hospital Stay (HOSPITAL_COMMUNITY): Payer: Medicare HMO

## 2019-07-04 DIAGNOSIS — I10 Essential (primary) hypertension: Secondary | ICD-10-CM | POA: Diagnosis not present

## 2019-07-04 DIAGNOSIS — I6389 Other cerebral infarction: Secondary | ICD-10-CM | POA: Diagnosis not present

## 2019-07-04 DIAGNOSIS — I639 Cerebral infarction, unspecified: Secondary | ICD-10-CM | POA: Diagnosis not present

## 2019-07-04 DIAGNOSIS — I6340 Cerebral infarction due to embolism of unspecified cerebral artery: Secondary | ICD-10-CM | POA: Diagnosis not present

## 2019-07-04 DIAGNOSIS — R4182 Altered mental status, unspecified: Secondary | ICD-10-CM | POA: Diagnosis present

## 2019-07-04 LAB — ECHOCARDIOGRAM COMPLETE: Weight: 2148.16 oz

## 2019-07-04 MED ORDER — LEVOTHYROXINE SODIUM 25 MCG PO TABS
137.0000 ug | ORAL_TABLET | Freq: Every day | ORAL | Status: DC
  Filled 2019-07-04: qty 1

## 2019-07-04 MED ORDER — ATORVASTATIN CALCIUM 80 MG PO TABS
80.0000 mg | ORAL_TABLET | Freq: Every day | ORAL | Status: DC
  Administered 2019-07-04: 80 mg via ORAL
  Filled 2019-07-04: qty 1

## 2019-07-04 MED ORDER — LISINOPRIL-HYDROCHLOROTHIAZIDE 20-12.5 MG PO TABS: 1.0000 | ORAL_TABLET | Freq: Every day | ORAL | Status: DC

## 2019-07-04 MED ORDER — ASPIRIN EC 81 MG PO TBEC
81.0000 mg | DELAYED_RELEASE_TABLET | Freq: Two times a day (BID) | ORAL | Status: DC
  Administered 2019-07-04: 81 mg via ORAL
  Filled 2019-07-04: qty 1

## 2019-07-04 MED ORDER — ATORVASTATIN CALCIUM 80 MG PO TABS: 80.0000 mg | ORAL_TABLET | Freq: Every day | ORAL | Status: DC

## 2019-07-04 MED ORDER — PROPRANOLOL HCL ER 60 MG PO CP24
60.0000 mg | ORAL_CAPSULE | Freq: Every day | ORAL | Status: DC
  Administered 2019-07-04: 60 mg via ORAL
  Filled 2019-07-04: qty 1

## 2019-07-04 MED ORDER — LISINOPRIL 20 MG PO TABS
20.0000 mg | ORAL_TABLET | Freq: Every day | ORAL | Status: DC
  Administered 2019-07-04: 20 mg via ORAL
  Filled 2019-07-04: qty 1

## 2019-07-04 MED ORDER — VITAMIN B-12 1000 MCG PO TABS
1000.0000 ug | ORAL_TABLET | Freq: Every day | ORAL | Status: DC
  Administered 2019-07-04: 1000 ug via ORAL
  Filled 2019-07-04: qty 1

## 2019-07-04 MED ORDER — HYDROCHLOROTHIAZIDE 12.5 MG PO CAPS
12.5000 mg | ORAL_CAPSULE | Freq: Every day | ORAL | Status: DC
  Administered 2019-07-04: 12.5 mg via ORAL
  Filled 2019-07-04: qty 1

## 2019-07-04 MED ORDER — PANCRELIPASE (LIP-PROT-AMYL) 12000-38000 UNITS PO CPEP
12000.0000 [IU] | ORAL_CAPSULE | Freq: Three times a day (TID) | ORAL | Status: DC
  Administered 2019-07-04: 12000 [IU] via ORAL
  Filled 2019-07-04 (×3): qty 1

## 2019-07-04 MED ORDER — LEVETIRACETAM 500 MG PO TABS
500.0000 mg | ORAL_TABLET | Freq: Two times a day (BID) | ORAL | Status: DC
  Administered 2019-07-04: 500 mg via ORAL
  Filled 2019-07-04: qty 1

## 2019-07-04 MED ORDER — TAMSULOSIN HCL 0.4 MG PO CAPS
0.4000 mg | ORAL_CAPSULE | Freq: Every day | ORAL | Status: DC
  Administered 2019-07-04: 0.4 mg via ORAL
  Filled 2019-07-04: qty 1

## 2019-07-04 NOTE — Progress Notes (Signed)
EEG complete - results pending 

## 2019-07-04 NOTE — Evaluation (Signed)
Occupational Therapy Evaluation and Discharge Patient Details Name: [name] Potter MRN: [id number] DOB: [birth year] Today's Date: 07/04/2019    History of Present Illness Pt is an 34 year man admitted with sudden onset of leaning to the R side, speech difficulties and AMS. MRI of brain negative for acute changes. PMH: CAD, HLD, recent UTI, dementia.   Clinical Impression   Pt's wife participated in session and reports pt is functioning at his baseline and given his current mobility, family will be able to care for him at home as prior to admission. Wife denies any DME needs.     Follow Up Recommendations  No OT follow up    Equipment Recommendations  None recommended by OT    Recommendations for Other Services       Precautions / Restrictions Precautions Precautions: Fall Precaution Comments: incontinent      Mobility Bed Mobility Overal bed mobility: Needs Assistance Bed Mobility: Supine to Sit;Sit to Supine     Supine to sit: Min assist Sit to supine: Mod assist   General bed mobility comments: verbal and physical cues to initiate LEs over EOB, assist for LEs back into bed  Transfers Overall transfer level: Needs assistance   Transfers: Sit to/from Stand Sit to Stand: +2 physical assistance;Min assist         General transfer comment: assist to rise and steady, pt initially with posterior lean, assisted to correct    Balance Overall balance assessment: Needs assistance Sitting-balance support: Feet unsupported Sitting balance-Leahy Scale: Fair Sitting balance - Comments: posterior lean, but no overt LOB, corrects with verbal cues   Standing balance support: Bilateral upper extremity supported Standing balance-Leahy Scale: Poor                             ADL either performed or assessed with clinical judgement   ADL Overall ADL's : At baseline                                             Vision Patient Visual Report:  No change from baseline       Perception     Praxis      Pertinent Vitals/Pain Pain Assessment: Faces Faces Pain Scale: No hurt     Hand Dominance     Extremity/Trunk Assessment Upper Extremity Assessment Upper Extremity Assessment: Overall WFL for tasks assessed   Lower Extremity Assessment Lower Extremity Assessment: Defer to PT evaluation   Cervical / Trunk Assessment Cervical / Trunk Assessment: Kyphotic   Communication Communication Communication: HOH   Cognition Arousal/Alertness: Awake/alert Behavior During Therapy: WFL for tasks assessed/performed Overall Cognitive Status: History of cognitive impairments - at baseline                                     General Comments       Exercises     Shoulder Instructions      Home Living Family/patient expects to be discharged to:: Private residence Living Arrangements: Children(son and daughter in law) Available Help at Discharge: Family;Available 24 hours/day Type of Home: House Home Access: Level entry     Home Layout: One level  Prior Functioning/Environment Level of Independence: Needs assistance  Gait / Transfers Assistance Needed: walks with assistance, no device ADL's / Homemaking Assistance Needed: self feeds, otherwise dependent            OT Problem List:        OT Treatment/Interventions:      OT Goals(Current goals can be found in the care plan section) Acute Rehab OT Goals Patient Stated Goal: wife wants pt to go home  OT Frequency:     Barriers to D/C:            Co-evaluation PT/OT/SLP Co-Evaluation/Treatment: Yes Reason for Co-Treatment: For patient/therapist safety;Necessary to address cognition/behavior during functional activity   OT goals addressed during session: ADL's and self-care;Proper use of Adaptive equipment and DME      AM-PAC OT "6 Clicks" Daily Activity     Outcome Measure Help from another person eating  meals?: A Little Help from another person taking care of personal grooming?: Total Help from another person toileting, which includes using toliet, bedpan, or urinal?: Total Help from another person bathing (including washing, rinsing, drying)?: Total Help from another person to put on and taking off regular upper body clothing?: Total Help from another person to put on and taking off regular lower body clothing?: Total 6 Click Score: 8   End of Session Nurse Communication: Other (comment)(appropriate for home discharge)  Activity Tolerance: Patient tolerated treatment well Patient left: in bed;with call bell/phone within reach;with family/visitor present  OT Visit Diagnosis: Other abnormalities of gait and mobility (R26.89);Other symptoms and signs involving cognitive function                Time: [id number] OT Time Calculation (min): 27 min Charges:  OT General Charges $OT Visit: 1 Visit OT Evaluation $OT Eval Moderate Complexity: 1 Mod  Nestor Lewandowsky, OTR/L Acute Rehabilitation Services Pager: [phone] Office: [phone]  Malka So 07/04/2019, 3:24 PM

## 2019-07-04 NOTE — ED Notes (Signed)
Patient verbalizes understanding of discharge instructions. Opportunity for questioning and answers were provided. Armband removed by staff, pt discharged from ED with wife. Case Manager has met with patient before discharge.

## 2019-07-04 NOTE — Progress Notes (Signed)
Carotid duplex has been completed.   Preliminary results in CV Proc.   Abram Sander 07/04/2019 9:02 AM

## 2019-07-04 NOTE — ED Notes (Signed)
Tele  Breakfast Ordered 

## 2019-07-04 NOTE — Procedures (Addendum)
Patient Name: [name] Potter  MRN: [id number]  Epilepsy Attending: Lora Havens  Referring Physician/Provider: Dr. Antony Contras Date: 07/04/2019 Duration: 28.45 minutes  Patient history: 84 year old male who presented with sudden onset speech disturbance and leaning to the right.  EEG evaluate for seizures.  Level of alertness: Awake  AEDs during EEG study: Keppra  Technical aspects: This EEG study was done with scalp electrodes positioned according to the 10-20 International system of electrode placement. Electrical activity was acquired at a sampling rate of 500Hz  and reviewed with a high frequency filter of 70Hz  and a low frequency filter of 1Hz . EEG data were recorded continuously and digitally stored.   Description: No clear posterior dominant rhythm was seen.  EEG showed continuous generalized 5 to 6 Hz theta slowing. Hyperventilation and photic stimulation were not performed.     ABNORMALITY -Continuous slow, generalized  IMPRESSION: This study is suggestive of mild to moderate diffuse encephalopathy, nonspecific etiology.  No seizures or epileptiform discharges were seen throughout the recording.   Seana Underwood Barbra Sarks

## 2019-07-04 NOTE — Progress Notes (Signed)
STROKE TEAM PROGRESS NOTE   INTERVAL HISTORY Patient is sitting up comfortably in bed.  He states he does not know why he is here but his wife brought him.  He apparently presented with sudden onset of speech and language difficulties but symptoms were too mild to consider TPA.  Is remained stable overnight.  Blood pressure adequately controlled.  MRI scan of the brain has been performed which shows no acute abnormality. LDL cholesterol is elevated 146 mg percent. Vitals:   07/04/19 1230 07/04/19 1245 07/04/19 1300 07/04/19 1345  BP: (!) 148/69 137/62 134/65 140/64  Pulse: (!) 48 (!) 46 (!) 48 65  Resp: 15 14 15 15   Temp:      TempSrc:      SpO2: 97% 99% 100% 96%  Weight:       CBC:  Recent Labs  Lab 07/03/19 1847 07/03/19 1855  WBC 6.3  --   NEUTROABS 3.8  --   HGB 12.8* 12.6*  HCT 38.9* 37.0*  MCV 96.0  --   PLT 212  --    Basic Metabolic Panel:  Recent Labs  Lab 07/03/19 1847 07/03/19 1855  NA 138 138  K 4.2 4.1  CL 104 102  CO2 26  --   GLUCOSE 95 88  BUN 15 16  CREATININE 1.05 1.10  CALCIUM 9.4  --    Lipid Panel:     Component Value Date/Time   CHOL (H) 02/18/2007 0620    219        ATP III CLASSIFICATION:  <200     mg/dL   Desirable  200-239  mg/dL   Borderline High  >=240    mg/dL   High          TRIG 231 (H) 02/18/2007 0620   HDL 27 (L) 02/18/2007 0620   CHOLHDL 8.1 02/18/2007 0620   VLDL 46 (H) 02/18/2007 0620   LDLCALC (H) 02/18/2007 0620    146        Total Cholesterol/HDL:CHD Risk Coronary Heart Disease Risk Table                     Men   Women  1/2 Average Risk   3.4   3.3  Average Risk       5.0   4.4  2 X Average Risk   9.6   7.1  3 X Average Risk  23.4   11.0        Use the calculated Patient Ratio above and the CHD Risk Table to determine the patient's CHD Risk.        ATP III CLASSIFICATION (LDL):  <100     mg/dL   Optimal  100-129  mg/dL   Near or Above                    Optimal  130-159  mg/dL   Borderline  160-189   mg/dL   High  >190     mg/dL   Very High   HgbA1c: No results found for: HGBA1C Urine Drug Screen: No results found for: LABOPIA, COCAINSCRNUR, LABBENZ, AMPHETMU, THCU, LABBARB  Alcohol Level No results found for: ETH  IMAGING past 24 hours MR BRAIN WO CONTRAST  Result Date: 07/03/2019 CLINICAL DATA:  Stroke follow-up EXAM: MRI HEAD WITHOUT CONTRAST TECHNIQUE: Multiplanar, multiecho pulse sequences of the brain and surrounding structures were obtained without intravenous contrast. COMPARISON:  Head CT 07/03/2019 FINDINGS: Brain: No acute infarct, acute hemorrhage or extra-axial  collection. Early confluent hyperintense T2-weighted signal of the periventricular and deep white matter, most commonly due to chronic ischemic microangiopathy. There is generalized atrophy without lobar predilection. No chronic microhemorrhage. Normal midline structures. Vascular: Normal flow voids. Skull and upper cervical spine: Normal marrow signal. Sinuses/Orbits: Negative. Other: None IMPRESSION: 1. No acute intracranial abnormality. 2. Generalized atrophy and chronic ischemic microangiopathy. Electronically Signed   By: Ulyses Jarred M.D.   On: 07/03/2019 22:49   DG Chest Port 1 View  Result Date: 07/03/2019 CLINICAL DATA:  Altered mental status EXAM: PORTABLE CHEST 1 VIEW COMPARISON:  02/18/2007 FINDINGS: The heart size and mediastinal contours are within normal limits. Both lungs are clear. The visualized skeletal structures are unremarkable. Aortic atherosclerosis. IMPRESSION: No active disease. Electronically Signed   By: Donavan Foil M.D.   On: 07/03/2019 19:13   EEG adult  Result Date: 07/04/2019 Lora Havens, MD     07/04/2019  1:03 PM Patient Name: [name] Potter MRN: [id number] Epilepsy Attending: Lora Havens Referring Physician/Provider: Dr. Antony Contras Date: 07/04/2019 Duration: 28.45 minutes Patient history: 84 year old male who presented with sudden onset speech disturbance and leaning to the  right.  EEG evaluate for seizures. Level of alertness: Awake AEDs during EEG study: Keppra Technical aspects: This EEG study was done with scalp electrodes positioned according to the 10-20 International system of electrode placement. Electrical activity was acquired at a sampling rate of 500Hz  and reviewed with a high frequency filter of 70Hz  and a low frequency filter of 1Hz . EEG data were recorded continuously and digitally stored. Description: No clear posterior dominant rhythm was seen.  EEG showed continuous generalized 5 to 6 Hz theta slowing. Hyperventilation and photic stimulation were not performed.   ABNORMALITY -Continuous slow, generalized IMPRESSION: This study is suggestive of mild to moderate diffuse encephalopathy, nonspecific etiology.  No seizures or epileptiform discharges were seen throughout the recording. Lora Havens   ECHOCARDIOGRAM COMPLETE  Result Date: 07/04/2019    ECHOCARDIOGRAM REPORT   Patient Name:   [name] Potter Date of Exam: 07/04/2019 Medical Rec #:  [id number]        Height:       70.0 in Accession #:    [id number]       Weight:       134.3 lb Date of Birth:  25-Dec-1935       BSA:          1.762 m Patient Age:    84 years         BP:           167/67 mmHg Patient Gender: M                HR:           43 bpm. Exam Location:  Inpatient Procedure: 2D Echo Indications:    Stroke 434.91 / I163.9  History:        Patient has no prior history of Echocardiogram examinations. No                 prior cardiac history.  Sonographer:    Vikki Ports Turrentine Referring Phys: [id number] Elwyn Reach  Sonographer Comments: Image acquisition challenging due to patient body habitus. Extremely difficult study due to small rib spacing and poor patient compliance. IMPRESSIONS  1. Left ventricular ejection fraction, by estimation, is 55 to 60%. The left ventricle has normal function. The left ventricle has no regional wall motion abnormalities. There is mild left ventricular hypertrophy.  Left  ventricular diastolic parameters are consistent with Grade II diastolic dysfunction (pseudonormalization).  2. Right ventricular systolic function is normal. The right ventricular size is normal. Tricuspid regurgitation signal is inadequate for assessing PA pressure.  3. The mitral valve is normal in structure. No evidence of mitral valve regurgitation. No evidence of mitral stenosis.  4. The aortic valve was not well visualized. Aortic valve regurgitation is not visualized. No aortic stenosis is present.  5. The inferior vena cava is normal in size with greater than 50% respiratory variability, suggesting right atrial pressure of 3 mmHg. FINDINGS  Left Ventricle: Left ventricular ejection fraction, by estimation, is 55 to 60%. The left ventricle has normal function. The left ventricle has no regional wall motion abnormalities. The left ventricular internal cavity size was normal in size. There is  mild left ventricular hypertrophy. Left ventricular diastolic parameters are consistent with Grade II diastolic dysfunction (pseudonormalization). Right Ventricle: The right ventricular size is normal. No increase in right ventricular wall thickness. Right ventricular systolic function is normal. Tricuspid regurgitation signal is inadequate for assessing PA pressure. Left Atrium: Left atrial size was normal in size. Right Atrium: Right atrial size was normal in size. Pericardium: There is no evidence of pericardial effusion. Mitral Valve: The mitral valve is normal in structure. No evidence of mitral valve regurgitation. No evidence of mitral valve stenosis. Tricuspid Valve: The tricuspid valve is normal in structure. Tricuspid valve regurgitation is not demonstrated. Aortic Valve: The aortic valve was not well visualized. Aortic valve regurgitation is not visualized. No aortic stenosis is present. Pulmonic Valve: The pulmonic valve was normal in structure. Pulmonic valve regurgitation is not visualized. Aorta: The aortic  root is normal in size and structure. Venous: The inferior vena cava is normal in size with greater than 50% respiratory variability, suggesting right atrial pressure of 3 mmHg. IAS/Shunts: No atrial level shunt detected by color flow Doppler.  LEFT VENTRICLE PLAX 2D LVIDd:         4.57 cm  Diastology LVIDs:         3.40 cm  LV e' medial: 6.83 cm/s LV PW:         1.00 cm LV IVS:        0.97 cm LVOT diam:     1.90 cm LV SV:         62 LV SV Index:   35 LVOT Area:     2.84 cm  RIGHT VENTRICLE RV S prime:     12.20 cm/s TAPSE (M-mode): 2.7 cm LEFT ATRIUM         Index LA diam:    3.00 cm 1.70 cm/m  AORTIC VALVE LVOT Vmax:   82.00 cm/s LVOT Vmean:  53.700 cm/s LVOT VTI:    0.217 m  AORTA Ao Root diam: 3.50 cm  SHUNTS Systemic VTI:  0.22 m Systemic Diam: 1.90 cm Loralie Champagne MD Electronically signed by Loralie Champagne MD Signature Date/Time: 07/04/2019/10:13:29 AM    Final    CT HEAD CODE STROKE WO CONTRAST  Result Date: 07/03/2019 CLINICAL DATA:  Code stroke. Neuro deficit, acute, stroke suspected. EXAM: CT HEAD WITHOUT CONTRAST TECHNIQUE: Contiguous axial images were obtained from the base of the skull through the vertex without intravenous contrast. COMPARISON:  None. FINDINGS: Brain: No acute infarct, acute intracranial hemorrhage, mass, or midline shift is identified. Asymmetric extra-axial CSF over the left cerebral convexity measures up to 8 mm in thickness in the frontal region with mild underlying gyral flattening but no other  significant mass effect. The ventricles are normal in size. There is a cavum septum pellucidum et vergae. Hypodensities in the cerebral white matter bilaterally are nonspecific but compatible with moderate chronic small vessel ischemic disease. Vascular: Calcified atherosclerosis at the skull base. No hyperdense vessel. Skull: No fracture or suspicious osseous lesion. Sinuses/Orbits: Paranasal sinuses and mastoid air cells are clear. Unremarkable orbits. Other: None. ASPECTS [facility]  Stroke Program Early CT Score) - Ganglionic level infarction (caudate, lentiform nuclei, internal capsule, insula, M1-M3 cortex): 7 - Supraganglionic infarction (M4-M6 cortex): 3 Total score (0-10 with 10 being normal): 10 IMPRESSION: 1. No evidence of acute intracranial abnormality. 2. ASPECTS is 10. 3. Moderate chronic small vessel ischemic disease. 4. 8 mm thick low-density collection over the left cerebral convexity, likely a subdural hygroma. No significant mass effect. These results were communicated to Dr. Lorraine Lax at 7:04 pm on 07/03/2019 by text page via the [facility] messaging system. Electronically Signed   By: Logan Bores M.D.   On: 07/03/2019 19:04   VAS US CAROTID (at [facility] and [facility] only)  Result Date: 07/04/2019 Carotid Arterial Duplex Study Indications:       CVA. Risk Factors:      Hypertension, hyperlipidemia. Comparison Study:  no prior Performing Technologist: Abram Sander RVS  Examination Guidelines: A complete evaluation includes B-mode imaging, spectral Doppler, color Doppler, and power Doppler as needed of all accessible portions of each vessel. Bilateral testing is considered an integral part of a complete examination. Limited examinations for reoccurring indications may be performed as noted.  Right Carotid Findings: +----------+--------+--------+--------+------------------+--------+           PSV cm/sEDV cm/sStenosisPlaque DescriptionComments +----------+--------+--------+--------+------------------+--------+ CCA Prox  84      9               heterogenous               +----------+--------+--------+--------+------------------+--------+ CCA Distal73      14              heterogenous               +----------+--------+--------+--------+------------------+--------+ ICA Prox  61      14      1-39%   heterogenous               +----------+--------+--------+--------+------------------+--------+ ICA Distal79      13                                          +----------+--------+--------+--------+------------------+--------+ ECA       137                                                +----------+--------+--------+--------+------------------+--------+ +----------+--------+-------+--------+-------------------+           PSV cm/sEDV cmsDescribeArm Pressure (mmHG) +----------+--------+-------+--------+-------------------+ [id number]                                        +----------+--------+-------+--------+-------------------+ +---------+--------+--+--------+--+---------+ VertebralPSV cm/s45EDV cm/s10Antegrade +---------+--------+--+--------+--+---------+  Left Carotid Findings: +----------+--------+--------+--------+------------------+--------+           PSV cm/sEDV cm/sStenosisPlaque DescriptionComments +----------+--------+--------+--------+------------------+--------+ CCA Prox  93      13  heterogenous               +----------+--------+--------+--------+------------------+--------+ CCA Distal75      9               heterogenous               +----------+--------+--------+--------+------------------+--------+ ICA Prox  73      15      1-39%   heterogenous               +----------+--------+--------+--------+------------------+--------+ ICA Distal74      18                                         +----------+--------+--------+--------+------------------+--------+ ECA       106                                                +----------+--------+--------+--------+------------------+--------+ +----------+--------+--------+--------+-------------------+           PSV cm/sEDV cm/sDescribeArm Pressure (mmHG) +----------+--------+--------+--------+-------------------+ [id number]                                         +----------+--------+--------+--------+-------------------+ +---------+--------+--+--------+--+---------+ VertebralPSV cm/s49EDV cm/s12Antegrade  +---------+--------+--+--------+--+---------+   Summary: Right Carotid: Velocities in the right ICA are consistent with a 1-39% stenosis. Left Carotid: Velocities in the left ICA are consistent with a 1-39% stenosis. Vertebrals: Bilateral vertebral arteries demonstrate antegrade flow. *See table(s) above for measurements and observations.  Electronically signed by Antony Contras MD on 07/04/2019 at 11:25:06 AM.    Final     PHYSICAL EXAM Pleasant elderly Caucasian male not in distress. . Afebrile. Head is nontraumatic. Neck is supple without bruit.    Cardiac exam no murmur or gallop. Lungs are clear to auscultation. Distal pulses are well felt. Neurological Exam ;  Awake  Alert oriented x 2.  Diminished attention, registration and recall.  Poor 3 word recall 0/3.  Able to name only 5 animals which can walk on 4 legs.  Normal speech and language.eye movements full without nystagmus.fundi were not visualized. Vision acuity and fields appear normal. Hearing is normal. Palatal movements are normal. Face symmetric. Tongue midline. Normal strength, tone, reflexes and coordination. Normal sensation. Gait deferred.  ASSESSMENT/PLAN [name] Potter is a 84 y.o. male with history of coronary artery disease, hypertension, hyperlipidemia, dementia  presenting with speech difficulty and leaning to the R.   Speech difficulty in pt with baseline dementia, felt not to be a Stroke or TIA  Code Stroke CT head No acute abnormality. Moderate Small vessel disease. L cerebral 43mm hygroma. ASPECTS 10.     MRI  No acute abnormality. Small vessel disease. Atrophy.   Carotid Doppler  B ICA 1-39% stenosis, VAs antegrade   2D Echo EF 55-60%. No source of embolus   EEG slow, no sz  LDL 146 mg percent  HgbA1c pending   Lovenox 40 mg sq daily for VTE prophylaxis  aspirin 81 mg daily prior to admission, now on aspirin 325 mg daily. Given no stroke and doubt TIA, resume home dose of aspirin and continue at  d/c  Therapy recommendations:  pending   Disposition:  Return home  Neuro follow  up to assist with dementia as needed   Hypertension  Elevated 180s . BP goal normotensive  Hyperlipidemia  Home meds:  Fish oil and lipitor 80, statin resumed in hospital  LDL pending  Continue statin and lovaza at discharge  Other Stroke Risk Factors  Advanced age  Hx Cigarette smoker, quit 12 yrs ago  Hx smokeless tobacco (chew) user, quit 8 yrs ago  Coronary artery disease, MI  Other Active Problems  Baseline dementia  Hospital day # 1 He presented with sudden onset of speech difficulties and confusion in setting of baseline dementia.  Neurological exam and MRI did not suggest a definite stroke.  Other possibilities of confusion need to be considered.  EEG to rule out seizures.  Check UA for infection.  Continue aspirin alone for stroke prevention.  Greater than 50% time during the 35-minute visit was spent on counseling and coordination of care about his confusion and speech difficulties and answering questions.  Discussed with care team and Dr. Luna Kitchens, MD To contact Stroke Continuity provider, please refer to [email or web address] After hours, contact General Neurology

## 2019-07-04 NOTE — Care Management CC44 (Signed)
Condition Code 44 Documentation Completed  Patient Details  Name: [name] Potter MRN: [id number] Date of Birth: 06/06/1935   Condition Code 44 given:  Yes Patient signature on Condition Code 44 notice:  Yes Documentation of 2 MD's agreement:  Yes Code 44 added to claim:  Yes    Jenni Thew, LCSW 07/04/2019, 4:45 PM

## 2019-07-04 NOTE — Discharge Summary (Signed)
Physician Discharge Summary  [name] Potter [id number] DOB: 09-03-1935 DOA: 07/03/2019  PCP: [facility]  Admit date: 07/03/2019 Discharge date: 07/04/2019  Admitted From: Home Discharge disposition: Home   Code Status: Full Code  Diet Recommendation: Cardiac diet   Recommendations for Outpatient Follow-Up:   1. Follow-up with PCP as an outpatient  Discharge Diagnosis:   Principal Problem:   Acute cerebrovascular accident (CVA) ([place]) Active Problems:   Brain tumor ([place])   Dementia ([place])   AMS (altered mental status)    History of Present Illness / Brief narrative:  [name] R Everetteis a 84 y.o.malewith PMH significant for dementia, DM 2, HTN, HLD, previous smoker, CAD sp CABG, CVA with residual right-sided weakness, hypogonadism not on supplement, osteoarthritis. Patient presented to the ED from home on 5/24 with altered mental status. Patient was last seen normal around noon. He was sitting in the chair and around 3:30 PM, family noted that he was slumped over to the right side. He had mild aphasia and mild right-sided weakness and hence EMS was called and code stroke was activated. Of note, patient recently completed treatment with UTI.    In the ED, patient appeared confused, lethargic. Heart rate was in 40s and 50s, blood pressure elevated 177/83, O2 sat 98% on 2 L by nasal cannula.   Labs unremarkable. CT head was negative for acute findings but showed an 8 mm subdural hygroma Neurology consulted. Per neurology note, TPA was not given because patient was gradually improving in his deficits were mild and nondisabling. Patient was admitted for stroke work-up. MRI brain did not show any acute findings, it showed chronic ischemic microangiopathy.  Hospital Course:  TIA -Presented with sudden onset difficulty with speech and leaning to the right side -CT scan of head and MRI brain were not suggestive of stroke. -Neurology consult  appreciated. -Per neurology note, TPA was not given because patient was gradually improving and his deficits were mild and nondisabling. -echocardiogram with EF 55-60%, carotid duplex did not show any significant stenosis. -Pending lipid panel, A1c -EEG was not suggestive of any epileptiform discharge. -PT/OT eval appreciated. -Prior to admission, patient was on aspirin 81 mg daily and Lipitor 80 mg daily. -Neurology has increased aspirin to 81 mg twice daily.  Continue Lipitor 80 mg daily.    Dementia -Supportive care.   -I would hold Aricept because of significant bradycardia.  Hypertension -Home meds include Lisinopril/HCTZ 20/12.5 mg daily, propranolol 60 mg daily and Flomax 0.4 mg daily. -Since there is no radiographic evidence of stroke, I would resume his home meds.  CAD sp CABG -Aspirin and statin  Hypothyroidism -continue Synthroid  History of seizure -continue Keppra  Recent UTI -completed the course of Ceftin.  Mobility: PT/OT eval pending Code Status: Full code per chart  Subjective:  Seen and examined this afternoon.  Pleasant elderly Caucasian male.  Lying on bed.  Not in distress.  Wife at bedside. Patient feels much better.  Looks better to the wife. She is concerned about his dementia flaring up and wants to take him home today  Discharge Exam:   Vitals:   07/04/19 1515 07/04/19 1530 07/04/19 1545 07/04/19 1600  BP:      Pulse: (!) 45 (!) 46 (!) 45 (!) 42  Resp: 16 16 16 12   Temp:      TempSrc:      SpO2: 98% 97% 97% 98%  Weight:        Body mass index is 19.26 kg/m.  General exam: Appears calm and comfortable.  Not in physical distress Skin: No rashes, lesions or ulcers. HEENT: Atraumatic, normocephalic, supple neck, no obvious bleeding Lungs: Clear to auscultation bilaterally CVS: Regular rate and rhythm, no murmur GI/Abd soft, nontender, nondistended, bowel sound present CNS: Alert, awake, oriented to place.  Demented at baseline.   Able to answer simple questions Psychiatry: Mood appropriate Extremities: No pedal edema, no calf tenderness  Discharge Instructions:  Wound care: None Discharge Instructions    Diet - low sodium heart healthy   Complete by: As directed    Increase activity slowly   Complete by: As directed       Allergies as of 07/04/2019      Reactions   Acetaminophen    Patient unable to explain but says the [place] told him not to take it anymore. 07/03/19: Wife says pt has taken med with no issues.      Medication List    STOP taking these medications   donepezil 10 MG tablet Commonly known as: ARICEPT   ipratropium 0.03 % nasal spray Commonly known as: ATROVENT   ofloxacin 0.3 % OTIC solution Commonly known as: FLOXIN   predniSONE 20 MG tablet Commonly known as: DELTASONE     TAKE these medications   aspirin 81 MG tablet Take 81 mg by mouth daily.   atorvastatin 80 MG tablet Commonly known as: LIPITOR Take 80 mg by mouth daily.   cefUROXime 500 MG tablet Commonly known as: CEFTIN Take 500 mg by mouth 2 (two) times daily with a meal. For 21 days.   Cholecalciferol 25 MCG (1000 UT) tablet Take 1,000 Units by mouth daily.   Fish Oil 1000 MG Caps Take 2 capsules by mouth 2 (two) times daily.   levETIRAcetam 500 MG tablet Commonly known as: KEPPRA Take 500 mg by mouth 2 (two) times daily.   levothyroxine 137 MCG tablet Commonly known as: SYNTHROID Take 137 mcg by mouth daily.   lipase/protease/amylase 12000-38000 units Cpep capsule Commonly known as: CREON Take 12,000 Units by mouth 3 (three) times daily with meals.   lisinopril-hydrochlorothiazide 20-12.5 MG tablet Commonly known as: ZESTORETIC Take 1 tablet by mouth daily.   loratadine 10 MG tablet Commonly known as: CLARITIN Take 10 mg by mouth daily as needed for allergies.   PROBIOTIC PO Take 1 tablet by mouth daily.   propranolol ER 60 MG 24 hr capsule Commonly known as: INDERAL LA Take 60 mg by mouth  daily.   tamsulosin 0.4 MG Caps capsule Commonly known as: FLOMAX Take 0.4 mg by mouth daily.   vitamin B-12 500 MCG tablet Commonly known as: CYANOCOBALAMIN Take 500 mcg by mouth daily.       Time coordinating discharge: 35 minutes  The results of significant diagnostics from this hospitalization (including imaging, microbiology, ancillary and laboratory) are listed below for reference.    Procedures and Diagnostic Studies:   MR BRAIN WO CONTRAST  Result Date: 07/03/2019 CLINICAL DATA:  Stroke follow-up EXAM: MRI HEAD WITHOUT CONTRAST TECHNIQUE: Multiplanar, multiecho pulse sequences of the brain and surrounding structures were obtained without intravenous contrast. COMPARISON:  Head CT 07/03/2019 FINDINGS: Brain: No acute infarct, acute hemorrhage or extra-axial collection. Early confluent hyperintense T2-weighted signal of the periventricular and deep white matter, most commonly due to chronic ischemic microangiopathy. There is generalized atrophy without lobar predilection. No chronic microhemorrhage. Normal midline structures. Vascular: Normal flow voids. Skull and upper cervical spine: Normal marrow signal. Sinuses/Orbits: Negative. Other: None IMPRESSION: 1. No acute intracranial  abnormality. 2. Generalized atrophy and chronic ischemic microangiopathy. Electronically Signed   By: Ulyses Jarred M.D.   On: 07/03/2019 22:49   DG Chest Port 1 View  Result Date: 07/03/2019 CLINICAL DATA:  Altered mental status EXAM: PORTABLE CHEST 1 VIEW COMPARISON:  02/18/2007 FINDINGS: The heart size and mediastinal contours are within normal limits. Both lungs are clear. The visualized skeletal structures are unremarkable. Aortic atherosclerosis. IMPRESSION: No active disease. Electronically Signed   By: Donavan Foil M.D.   On: 07/03/2019 19:13   EEG adult  Result Date: 07/04/2019 Lora Havens, MD     07/04/2019  1:03 PM Patient Name: DOREON OSADA MRN: [id number] Epilepsy Attending: Lora Havens Referring Physician/Provider: Dr. Antony Contras Date: 07/04/2019 Duration: 28.45 minutes Patient history: 84 year old male who presented with sudden onset speech disturbance and leaning to the right.  EEG evaluate for seizures. Level of alertness: Awake AEDs during EEG study: Keppra Technical aspects: This EEG study was done with scalp electrodes positioned according to the 10-20 International system of electrode placement. Electrical activity was acquired at a sampling rate of 500Hz  and reviewed with a high frequency filter of 70Hz  and a low frequency filter of 1Hz . EEG data were recorded continuously and digitally stored. Description: No clear posterior dominant rhythm was seen.  EEG showed continuous generalized 5 to 6 Hz theta slowing. Hyperventilation and photic stimulation were not performed.   ABNORMALITY -Continuous slow, generalized IMPRESSION: This study is suggestive of mild to moderate diffuse encephalopathy, nonspecific etiology.  No seizures or epileptiform discharges were seen throughout the recording. Lora Havens   ECHOCARDIOGRAM COMPLETE  Result Date: 07/04/2019    ECHOCARDIOGRAM REPORT   Patient Name:   JADIEN CUGINI Date of Exam: 07/04/2019 Medical Rec #:  [id number]        Height:       70.0 in Accession #:    [id number]       Weight:       134.3 lb Date of Birth:  1935/02/23       BSA:          1.762 m Patient Age:    75 years         BP:           167/67 mmHg Patient Gender: M                HR:           43 bpm. Exam Location:  Inpatient Procedure: 2D Echo Indications:    Stroke 434.91 / I163.9  History:        Patient has no prior history of Echocardiogram examinations. No                 prior cardiac history.  Sonographer:    Vikki Ports Turrentine Referring Phys: [id number] Elwyn Reach  Sonographer Comments: Image acquisition challenging due to patient body habitus. Extremely difficult study due to small rib spacing and poor patient compliance. IMPRESSIONS  1. Left ventricular  ejection fraction, by estimation, is 55 to 60%. The left ventricle has normal function. The left ventricle has no regional wall motion abnormalities. There is mild left ventricular hypertrophy. Left ventricular diastolic parameters are consistent with Grade II diastolic dysfunction (pseudonormalization).  2. Right ventricular systolic function is normal. The right ventricular size is normal. Tricuspid regurgitation signal is inadequate for assessing PA pressure.  3. The mitral valve is normal in structure. No evidence of mitral valve regurgitation. No evidence of  mitral stenosis.  4. The aortic valve was not well visualized. Aortic valve regurgitation is not visualized. No aortic stenosis is present.  5. The inferior vena cava is normal in size with greater than 50% respiratory variability, suggesting right atrial pressure of 3 mmHg. FINDINGS  Left Ventricle: Left ventricular ejection fraction, by estimation, is 55 to 60%. The left ventricle has normal function. The left ventricle has no regional wall motion abnormalities. The left ventricular internal cavity size was normal in size. There is  mild left ventricular hypertrophy. Left ventricular diastolic parameters are consistent with Grade II diastolic dysfunction (pseudonormalization). Right Ventricle: The right ventricular size is normal. No increase in right ventricular wall thickness. Right ventricular systolic function is normal. Tricuspid regurgitation signal is inadequate for assessing PA pressure. Left Atrium: Left atrial size was normal in size. Right Atrium: Right atrial size was normal in size. Pericardium: There is no evidence of pericardial effusion. Mitral Valve: The mitral valve is normal in structure. No evidence of mitral valve regurgitation. No evidence of mitral valve stenosis. Tricuspid Valve: The tricuspid valve is normal in structure. Tricuspid valve regurgitation is not demonstrated. Aortic Valve: The aortic valve was not well visualized.  Aortic valve regurgitation is not visualized. No aortic stenosis is present. Pulmonic Valve: The pulmonic valve was normal in structure. Pulmonic valve regurgitation is not visualized. Aorta: The aortic root is normal in size and structure. Venous: The inferior vena cava is normal in size with greater than 50% respiratory variability, suggesting right atrial pressure of 3 mmHg. IAS/Shunts: No atrial level shunt detected by color flow Doppler.  LEFT VENTRICLE PLAX 2D LVIDd:         4.57 cm  Diastology LVIDs:         3.40 cm  LV e' medial: 6.83 cm/s LV PW:         1.00 cm LV IVS:        0.97 cm LVOT diam:     1.90 cm LV SV:         62 LV SV Index:   35 LVOT Area:     2.84 cm  RIGHT VENTRICLE RV S prime:     12.20 cm/s TAPSE (M-mode): 2.7 cm LEFT ATRIUM         Index LA diam:    3.00 cm 1.70 cm/m  AORTIC VALVE LVOT Vmax:   82.00 cm/s LVOT Vmean:  53.700 cm/s LVOT VTI:    0.217 m  AORTA Ao Root diam: 3.50 cm  SHUNTS Systemic VTI:  0.22 m Systemic Diam: 1.90 cm Loralie Champagne MD Electronically signed by Loralie Champagne MD Signature Date/Time: 07/04/2019/10:13:29 AM    Final    CT HEAD CODE STROKE WO CONTRAST  Result Date: 07/03/2019 CLINICAL DATA:  Code stroke. Neuro deficit, acute, stroke suspected. EXAM: CT HEAD WITHOUT CONTRAST TECHNIQUE: Contiguous axial images were obtained from the base of the skull through the vertex without intravenous contrast. COMPARISON:  None. FINDINGS: Brain: No acute infarct, acute intracranial hemorrhage, mass, or midline shift is identified. Asymmetric extra-axial CSF over the left cerebral convexity measures up to 8 mm in thickness in the frontal region with mild underlying gyral flattening but no other significant mass effect. The ventricles are normal in size. There is a cavum septum pellucidum et vergae. Hypodensities in the cerebral white matter bilaterally are nonspecific but compatible with moderate chronic small vessel ischemic disease. Vascular: Calcified atherosclerosis at the  skull base. No hyperdense vessel. Skull: No fracture or suspicious osseous lesion. Sinuses/Orbits:  Paranasal sinuses and mastoid air cells are clear. Unremarkable orbits. Other: None. ASPECTS [facility] Stroke Program Early CT Score) - Ganglionic level infarction (caudate, lentiform nuclei, internal capsule, insula, M1-M3 cortex): 7 - Supraganglionic infarction (M4-M6 cortex): 3 Total score (0-10 with 10 being normal): 10 IMPRESSION: 1. No evidence of acute intracranial abnormality. 2. ASPECTS is 10. 3. Moderate chronic small vessel ischemic disease. 4. 8 mm thick low-density collection over the left cerebral convexity, likely a subdural hygroma. No significant mass effect. These results were communicated to Dr. Lorraine Lax at 7:04 pm on 07/03/2019 by text page via the [facility] messaging system. Electronically Signed   By: Logan Bores M.D.   On: 07/03/2019 19:04   VAS US CAROTID (at [facility] and [facility] only)  Result Date: 07/04/2019 Carotid Arterial Duplex Study Indications:       CVA. Risk Factors:      Hypertension, hyperlipidemia. Comparison Study:  no prior Performing Technologist: Abram Sander RVS  Examination Guidelines: A complete evaluation includes B-mode imaging, spectral Doppler, color Doppler, and power Doppler as needed of all accessible portions of each vessel. Bilateral testing is considered an integral part of a complete examination. Limited examinations for reoccurring indications may be performed as noted.  Right Carotid Findings: +----------+--------+--------+--------+------------------+--------+           PSV cm/sEDV cm/sStenosisPlaque DescriptionComments +----------+--------+--------+--------+------------------+--------+ CCA Prox  84      9               heterogenous               +----------+--------+--------+--------+------------------+--------+ CCA Distal73      14              heterogenous               +----------+--------+--------+--------+------------------+--------+ ICA Prox  61       14      1-39%   heterogenous               +----------+--------+--------+--------+------------------+--------+ ICA Distal79      13                                         +----------+--------+--------+--------+------------------+--------+ ECA       137                                                +----------+--------+--------+--------+------------------+--------+ +----------+--------+-------+--------+-------------------+           PSV cm/sEDV cmsDescribeArm Pressure (mmHG) +----------+--------+-------+--------+-------------------+ [id number]                                        +----------+--------+-------+--------+-------------------+ +---------+--------+--+--------+--+---------+ VertebralPSV cm/s45EDV cm/s10Antegrade +---------+--------+--+--------+--+---------+  Left Carotid Findings: +----------+--------+--------+--------+------------------+--------+           PSV cm/sEDV cm/sStenosisPlaque DescriptionComments +----------+--------+--------+--------+------------------+--------+ CCA Prox  93      13              heterogenous               +----------+--------+--------+--------+------------------+--------+ CCA Distal75      9               heterogenous               +----------+--------+--------+--------+------------------+--------+  ICA Prox  73      15      1-39%   heterogenous               +----------+--------+--------+--------+------------------+--------+ ICA Distal74      18                                         +----------+--------+--------+--------+------------------+--------+ ECA       106                                                +----------+--------+--------+--------+------------------+--------+ +----------+--------+--------+--------+-------------------+           PSV cm/sEDV cm/sDescribeArm Pressure (mmHG) +----------+--------+--------+--------+-------------------+ [id number]                                          +----------+--------+--------+--------+-------------------+ +---------+--------+--+--------+--+---------+ VertebralPSV cm/s49EDV cm/s12Antegrade +---------+--------+--+--------+--+---------+   Summary: Right Carotid: Velocities in the right ICA are consistent with a 1-39% stenosis. Left Carotid: Velocities in the left ICA are consistent with a 1-39% stenosis. Vertebrals: Bilateral vertebral arteries demonstrate antegrade flow. *See table(s) above for measurements and observations.  Electronically signed by Antony Contras MD on 07/04/2019 at 11:25:06 AM.    Final      Labs:   Basic Metabolic Panel: Recent Labs  Lab 07/03/19 1847 07/03/19 1855  NA 138 138  K 4.2 4.1  CL 104 102  CO2 26  --   GLUCOSE 95 88  BUN 15 16  CREATININE 1.05 1.10  CALCIUM 9.4  --    GFR CrCl cannot be calculated (Unknown ideal weight.). Liver Function Tests: Recent Labs  Lab 07/03/19 1847  AST 28  ALT 30  ALKPHOS 69  BILITOT 0.6  PROT 6.4*  ALBUMIN 3.7   No results for input(s): LIPASE, AMYLASE in the last 168 hours. No results for input(s): AMMONIA in the last 168 hours. Coagulation profile Recent Labs  Lab 07/03/19 1847  INR 1.1    CBC: Recent Labs  Lab 07/03/19 1847 07/03/19 1855  WBC 6.3  --   NEUTROABS 3.8  --   HGB 12.8* 12.6*  HCT 38.9* 37.0*  MCV 96.0  --   PLT 212  --    Cardiac Enzymes: No results for input(s): CKTOTAL, CKMB, CKMBINDEX, TROPONINI in the last 168 hours. BNP: Invalid input(s): POCBNP CBG: Recent Labs  Lab 07/03/19 1844  GLUCAP 86   D-Dimer No results for input(s): DDIMER in the last 72 hours. Hgb A1c No results for input(s): HGBA1C in the last 72 hours. Lipid Profile No results for input(s): CHOL, HDL, LDLCALC, TRIG, CHOLHDL, LDLDIRECT in the last 72 hours. Thyroid function studies No results for input(s): TSH, T4TOTAL, T3FREE, THYROIDAB in the last 72 hours.  Invalid input(s): FREET3 Anemia work up No results for  input(s): VITAMINB12, FOLATE, FERRITIN, TIBC, IRON, RETICCTPCT in the last 72 hours. Microbiology Recent Results (from the past 240 hour(s))  SARS Coronavirus 2 by RT PCR (hospital order, performed in [facility] hospital lab) Nasopharyngeal Urine, Catheterized     Status: None   Collection Time: 07/03/19  7:27 PM   Specimen: Urine, Catheterized; Nasopharyngeal  Result Value Ref Range Status   SARS  Coronavirus 2 NEGATIVE NEGATIVE Final    Comment: (NOTE) SARS-CoV-2 target nucleic acids are NOT DETECTED. The SARS-CoV-2 RNA is generally detectable in upper and lower respiratory specimens during the acute phase of infection. The lowest concentration of SARS-CoV-2 viral copies this assay can detect is 250 copies / mL. A negative result does not preclude SARS-CoV-2 infection and should not be used as the sole basis for treatment or other patient management decisions.  A negative result may occur with improper specimen collection / handling, submission of specimen other than nasopharyngeal swab, presence of viral mutation(s) within the areas targeted by this assay, and inadequate number of viral copies (<250 copies / mL). A negative result must be combined with clinical observations, patient history, and epidemiological information. Fact Sheet for Patients:   StrictlyIdeas.no Fact Sheet for Healthcare Providers: BankingDealers.co.za This test is not yet approved or cleared  by the [place] FDA and has been authorized for detection and/or diagnosis of SARS-CoV-2 by FDA under an Emergency Use Authorization (EUA).  This EUA will remain in effect (meaning this test can be used) for the duration of the COVID-19 declaration under Section 564(b)(1) of the Act, 21 U.S.C. section 360bbb-3(b)(1), unless the authorization is terminated or revoked sooner. Performed at [facility], [place] [address] [place], [place] [address]   Urine Culture      Status: Abnormal (Preliminary result)   Collection Time: 07/03/19  7:27 PM   Specimen: Urine, Catheterized  Result Value Ref Range Status   Specimen Description URINE, CATHETERIZED  Final   Special Requests NONE  Final   Culture (A)  Final    40,000 COLONIES/mL PSEUDOMONAS AERUGINOSA SUSCEPTIBILITIES TO FOLLOW Performed at [facility], [place] [address] [place],  [address]    Report Status PENDING  Incomplete    Please note: You were cared for by a hospitalist during your hospital stay. Once you are discharged, your primary care physician will handle any further medical issues. Please note that NO REFILLS for any discharge medications will be authorized once you are discharged, as it is imperative that you return to your primary care physician (or establish a relationship with a primary care physician if you do not have one) for your post hospital discharge needs so that they can reassess your need for medications and monitor your lab values.  Signed: Terrilee Croak  [facility] 07/04/2019, 4:26 PM

## 2019-07-04 NOTE — ED Notes (Signed)
Patient transported to Vascular 

## 2019-07-04 NOTE — Evaluation (Signed)
Physical Therapy Evaluation Patient Details Name: [name] Potter MRN: [id number] DOB: 08-28-1935 Today's Date: 07/04/2019   History of Present Illness  Pt is an 30 year man admitted with sudden onset of leaning to the R side, speech difficulties and AMS. MRI of brain negative for acute changes, CT reveals subdural hygroma with no midline shift, EEG reveals encephalopathy. PMH: CAD, HLD, recent UTI, dementia.  Clinical Impression   Pt presents with generalized weakness, impaired cognition on eval which pt's wife states is close to baseline, unsteadiness in standing, high fall risk, impaired gait requiring HHA which is baseline, and impaired activity tolerance. Pt to benefit from acute PT to address deficits. Pt ambulated hallway distance with HHA from PT and OT, requiring multimodal cuing for safe mobility. Per pt's wife, pt is very close to baseline, pt has no supportive needs (HHPT, OT, RN, aide), and everyone is eager for him to d/c home. Given near-baseline status and no DME or supportive needs, PT recommending no follow up but continue 24/7 assist. PT will continue to follow acutely for mobility maintenance if remains inpatient. PT to progress mobility as tolerated.     Follow Up Recommendations Supervision/Assistance - 24 hour(per wife, pt has 24/7 assist and gets mobility assist from neice already)    Equipment Recommendations  None recommended by PT    Recommendations for Other Services       Precautions / Restrictions Precautions Precautions: Fall Precaution Comments: incontinent Restrictions Weight Bearing Restrictions: No      Mobility  Bed Mobility Overal bed mobility: Needs Assistance Bed Mobility: Supine to Sit;Sit to Supine     Supine to sit: Min assist Sit to supine: Mod assist   General bed mobility comments: verbal and physical cues to initiate LEs over EOB, assist for LEs back into bed  Transfers Overall transfer level: Needs assistance Equipment used: 2  person hand held assist Transfers: Sit to/from Stand Sit to Stand: +2 physical assistance;Min assist         General transfer comment: assist to rise and steady via HHA +2 , pt initially with posterior lean, assisted to correct  Ambulation/Gait Ambulation/Gait assistance: Min assist;+2 safety/equipment;+2 physical assistance Gait Distance (Feet): 125 Feet Assistive device: 1 person hand held assist Gait Pattern/deviations: Step-through pattern;Decreased stride length;Narrow base of support;Trunk flexed Gait velocity: decr   General Gait Details: Min assist to steady, guide pt in hallway. Verbal cuing for upright posture, looking down hallway as opposed to at feet. Urinary incontinence post-ambulation, required total assist to clean up.  Stairs            Wheelchair Mobility    Modified Rankin (Stroke Patients Only)       Balance Overall balance assessment: Needs assistance Sitting-balance support: Feet unsupported Sitting balance-Leahy Scale: Fair Sitting balance - Comments: posterior leaning, corrected with verbal cuing for anterior lean   Standing balance support: Bilateral upper extremity supported Standing balance-Leahy Scale: Poor Standing balance comment: reliant on PT to steady                             Pertinent Vitals/Pain Pain Assessment: No/denies pain Faces Pain Scale: No hurt    Home Living Family/patient expects to be discharged to:: Private residence Living Arrangements: Children(son and daughter in law) Available Help at Discharge: Family;Available 24 hours/day Type of Home: House Home Access: Level entry     Home Layout: One level Home Equipment: Hospital bed  Prior Function Level of Independence: Needs assistance   Gait / Transfers Assistance Needed: walks with handheld assistance, no device  ADL's / Homemaking Assistance Needed: self feeds, otherwise dependent for ADLs. Pt's neice is a PT, comes 2x/week to help out,  otherwise has 24/7 assist from son and daughter in law        Hand Dominance   Dominant Hand: Right    Extremity/Trunk Assessment   Upper Extremity Assessment Upper Extremity Assessment: Generalized weakness    Lower Extremity Assessment Lower Extremity Assessment: Generalized weakness    Cervical / Trunk Assessment Cervical / Trunk Assessment: Kyphotic  Communication   Communication: HOH  Cognition Arousal/Alertness: Awake/alert Behavior During Therapy: WFL for tasks assessed/performed Overall Cognitive Status: History of cognitive impairments - at baseline                                 General Comments: history of dementia. requires multimodal, repeated, simple cuing for direction following. Very pleasant and laughing throughout session      General Comments      Exercises     Assessment/Plan    PT Assessment Patient needs continued PT services  PT Problem List Decreased strength;Decreased mobility;Decreased activity tolerance;Decreased balance;Decreased knowledge of use of DME;Decreased cognition;Decreased safety awareness       PT Treatment Interventions DME instruction;Therapeutic activities;Gait training;Patient/family education;Balance training;Therapeutic exercise;Functional mobility training;Neuromuscular re-education    PT Goals (Current goals can be found in the Care Plan section)  Acute Rehab PT Goals Patient Stated Goal: wife wants pt to go home PT Goal Formulation: With patient/family Time For Goal Achievement: 07/18/19 Potential to Achieve Goals: Good    Frequency Min 3X/week   Barriers to discharge        Co-evaluation PT/OT/SLP Co-Evaluation/Treatment: Yes Reason for Co-Treatment: For patient/therapist safety;Complexity of the patient's impairments (multi-system involvement);To address functional/ADL transfers;Necessary to address cognition/behavior during functional activity PT goals addressed during session:  Mobility/safety with mobility;Balance OT goals addressed during session: ADL's and self-care;Proper use of Adaptive equipment and DME       AM-PAC PT "6 Clicks" Mobility  Outcome Measure Help needed turning from your back to your side while in a flat bed without using bedrails?: A Little Help needed moving from lying on your back to sitting on the side of a flat bed without using bedrails?: A Lot Help needed moving to and from a bed to a chair (including a wheelchair)?: A Lot Help needed standing up from a chair using your arms (e.g., wheelchair or bedside chair)?: A Lot Help needed to walk in hospital room?: A Little Help needed climbing 3-5 steps with a railing? : A Lot 6 Click Score: 14    End of Session   Activity Tolerance: Patient tolerated treatment well Patient left: in bed;with call bell/phone within reach;with family/visitor present Nurse Communication: Mobility status PT Visit Diagnosis: Other abnormalities of gait and mobility (R26.89);Muscle weakness (generalized) (M62.81)    Time: [id number] PT Time Calculation (min) (ACUTE ONLY): 25 min   Charges:   PT Evaluation $PT Eval Low Complexity: 1 Low          Paula Zietz E, PT Acute Rehabilitation Services Pager [phone]  Office [phone]  Trella Thurmond D Elonda Husky 07/04/2019, 3:49 PM

## 2019-07-04 NOTE — Progress Notes (Signed)
  Echocardiogram 2D Echocardiogram has been performed.  [name] Potter A [name] Potter 07/04/2019, 9:10 AM

## 2019-07-04 NOTE — ED Notes (Signed)
Lunch Tray Ordered @ 1041. 

## 2019-07-04 NOTE — ED Notes (Signed)
Provider paged to notify that pt passed swallow screen and diet order is needed.

## 2019-07-05 LAB — URINE CULTURE: Culture: 40000 — AB

## 2019-07-06 ENCOUNTER — Telehealth: Payer: Self-pay

## 2019-07-06 NOTE — Telephone Encounter (Signed)
Post ED Visit - Positive Culture Follow-up  Culture report reviewed by antimicrobial stewardship pharmacist: [place] [facility] []  Elenor Quinones, Pharm.D. []  Heide Guile, Pharm.D., BCPS AQ-ID []  Parks Neptune, Pharm.D., BCPS []  Alycia Rossetti, Pharm.D., BCPS []  Ritchey, Pharm.D., BCPS, AAHIVP []  Legrand Como, Pharm.D., BCPS, AAHIVP []  Salome Arnt, PharmD, BCPS []  Johnnette Gourd, PharmD, BCPS []  Hughes Better, PharmD, BCPS []  Leeroy Cha, PharmD []  Laqueta Linden, PharmD, BCPS []  Albertina Parr, PharmD [place] Team []  Leodis Sias, PharmD []  Lindell Spar, PharmD []  Royetta Asal, PharmD []  Graylin Shiver, Rph []  Rema Fendt) Glennon Mac, PharmD []  Arlyn Dunning, PharmD []  Netta Cedars, PharmD []  Dia Sitter, PharmD []  Leone Haven, PharmD []  Gretta Arab, PharmD []  Theodis Shove, PharmD []  Peggyann Juba, PharmD []  Reuel Boom, PharmD   Positive urine culture Treated with Cefuroxime, organism sensitive to the same and no further patient follow-up is required at this time.  Genia Del 07/06/2019, 9:35 AM

## 2019-07-06 NOTE — Progress Notes (Signed)
ED Antimicrobial Stewardship Positive Culture Follow Up   [name] Potter is an 84 y.o. male who presented to [facility] on 07/03/2019 with a chief complaint of  Chief Complaint  Patient presents with  . Code Stroke    Recent Results (from the past 720 hour(s))  SARS Coronavirus 2 by RT PCR (hospital order, performed in [facility] hospital lab) Nasopharyngeal Urine, Catheterized     Status: None   Collection Time: 07/03/19  7:27 PM   Specimen: Urine, Catheterized; Nasopharyngeal  Result Value Ref Range Status   SARS Coronavirus 2 NEGATIVE NEGATIVE Final    Comment: (NOTE) SARS-CoV-2 target nucleic acids are NOT DETECTED. The SARS-CoV-2 RNA is generally detectable in upper and lower respiratory specimens during the acute phase of infection. The lowest concentration of SARS-CoV-2 viral copies this assay can detect is 250 copies / mL. A negative result does not preclude SARS-CoV-2 infection and should not be used as the sole basis for treatment or other patient management decisions.  A negative result may occur with improper specimen collection / handling, submission of specimen other than nasopharyngeal swab, presence of viral mutation(s) within the areas targeted by this assay, and inadequate number of viral copies (<250 copies / mL). A negative result must be combined with clinical observations, patient history, and epidemiological information. Fact Sheet for Patients:   StrictlyIdeas.no Fact Sheet for Healthcare Providers: BankingDealers.co.za This test is not yet approved or cleared  by the [place] FDA and has been authorized for detection and/or diagnosis of SARS-CoV-2 by FDA under an Emergency Use Authorization (EUA).  This EUA will remain in effect (meaning this test can be used) for the duration of the COVID-19 declaration under Section 564(b)(1) of the Act, 21 U.S.C. section 360bbb-3(b)(1), unless the authorization is  terminated or revoked sooner. Performed at [facility], [place] [address], [place], [place] [address]   Urine Culture     Status: Abnormal   Collection Time: 07/03/19  7:27 PM   Specimen: Urine, Catheterized  Result Value Ref Range Status   Specimen Description URINE, CATHETERIZED  Final   Special Requests   Final    NONE Performed at [facility], [place] [address], [place], [place] [address]    Culture 40,000 COLONIES/mL PSEUDOMONAS AERUGINOSA (A)  Final   Report Status 07/05/2019 FINAL  Final   Organism ID, Bacteria PSEUDOMONAS AERUGINOSA (A)  Final      Susceptibility   Pseudomonas aeruginosa - MIC*    CEFTAZIDIME 4 SENSITIVE Sensitive     CIPROFLOXACIN <=0.25 SENSITIVE Sensitive     GENTAMICIN 2 SENSITIVE Sensitive     IMIPENEM 2 SENSITIVE Sensitive     PIP/TAZO 8 SENSITIVE Sensitive     CEFEPIME 8 SENSITIVE Sensitive     * 40,000 COLONIES/mL PSEUDOMONAS AERUGINOSA   Plan: Discontinue Cefuroxime   ED Provider: Emeterio Reeve PA-C   Acey Lav, PharmD  PGY1 Acute Care Pharmacy Resident 07/06/2019, 9:24 AM  Monday - Friday phone -  [phone] Saturday - Sunday phone - [phone]

## 2019-08-13 DIAGNOSIS — F0280 Dementia in other diseases classified elsewhere without behavioral disturbance: Secondary | ICD-10-CM | POA: Diagnosis not present

## 2019-08-13 DIAGNOSIS — G309 Alzheimer's disease, unspecified: Secondary | ICD-10-CM | POA: Diagnosis not present

## 2019-08-19 DIAGNOSIS — G309 Alzheimer's disease, unspecified: Secondary | ICD-10-CM | POA: Diagnosis not present

## 2019-08-19 DIAGNOSIS — G894 Chronic pain syndrome: Secondary | ICD-10-CM | POA: Diagnosis not present

## 2019-08-19 DIAGNOSIS — I10 Essential (primary) hypertension: Secondary | ICD-10-CM | POA: Diagnosis not present

## 2019-08-19 DIAGNOSIS — F419 Anxiety disorder, unspecified: Secondary | ICD-10-CM | POA: Diagnosis not present

## 2019-08-19 DIAGNOSIS — Z515 Encounter for palliative care: Secondary | ICD-10-CM | POA: Diagnosis not present

## 2019-08-21 DIAGNOSIS — F419 Anxiety disorder, unspecified: Secondary | ICD-10-CM | POA: Diagnosis not present

## 2019-08-21 DIAGNOSIS — I10 Essential (primary) hypertension: Secondary | ICD-10-CM | POA: Diagnosis not present

## 2019-09-10 DEATH — deceased

## 2020-06-05 IMAGING — CT CT HEAD CODE STROKE
4 series · 16 of 47 positions shown, 18 images · non-contrast
Comparison: None.

CLINICAL DATA: Code stroke. Neuro deficit, acute, stroke suspected.

EXAM:
CT HEAD WITHOUT CONTRAST
TECHNIQUE: Contiguous axial images were obtained from the base of the skull
through the vertex without intravenous contrast.

[Series 2: head 5.0 st · axial · 0.42mm/px · z∈[-151,-21]mm · 7 of 36 slices shown, 9 images]
[im 5/36  brain]
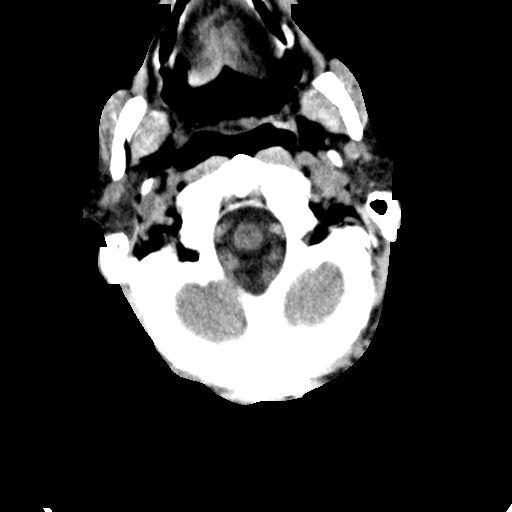
[im 5/36  bone]
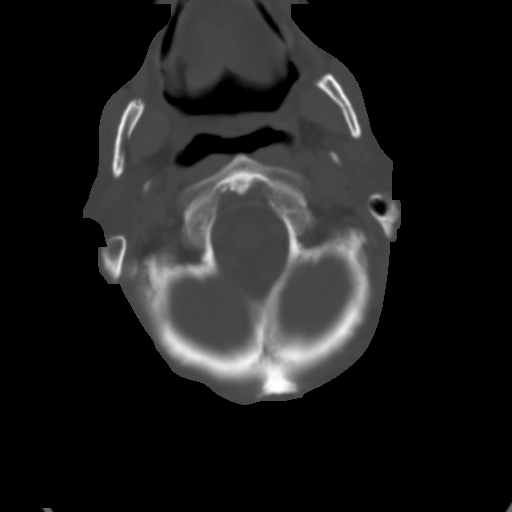
[im 9/36  brain]
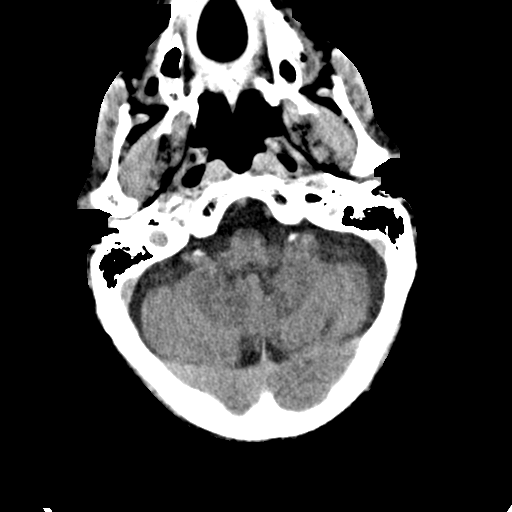
[im 14/36  brain]
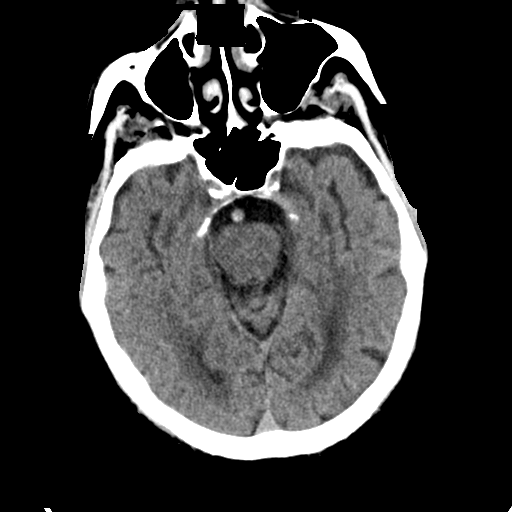
[im 18/36  brain]
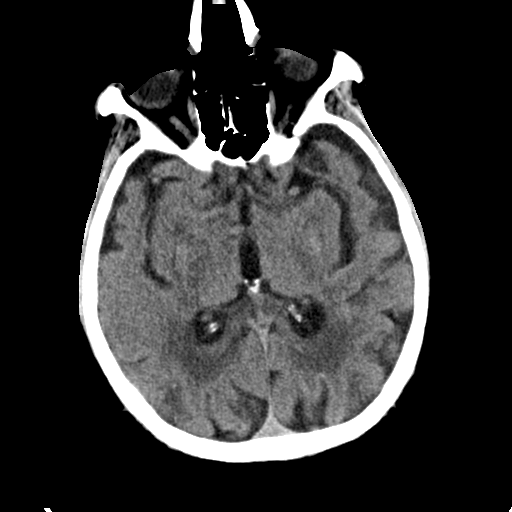
[im 22/36  brain]
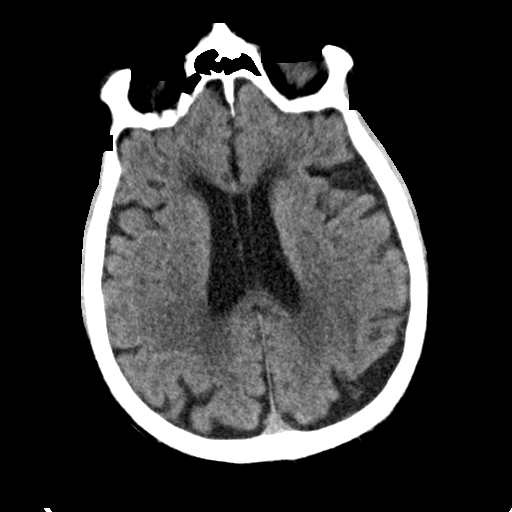
[im 22/36  bone]
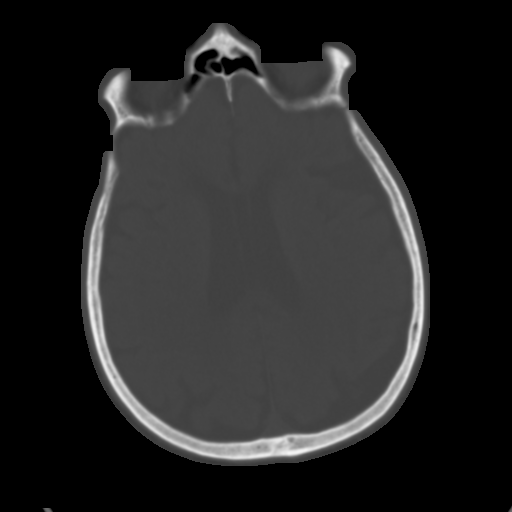
[im 27/36  brain]
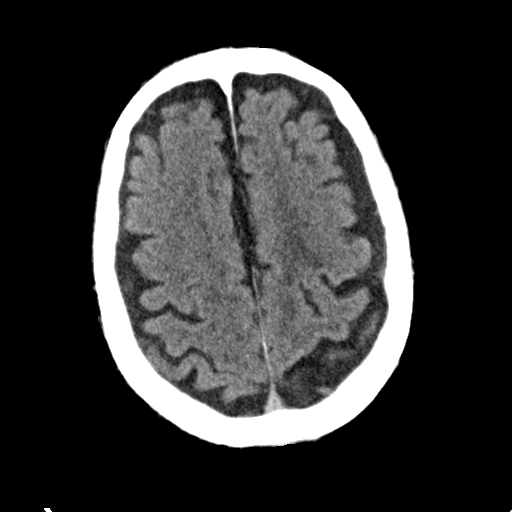
[im 31/36  brain]
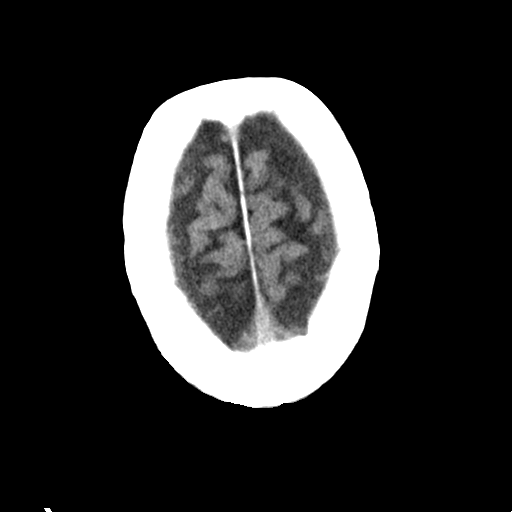

[Series 3: head 2.0 bone · axial · 0.42mm/px · z∈[-155,-119]mm · 3 of 90 slices shown]
[im 9/90  bone]
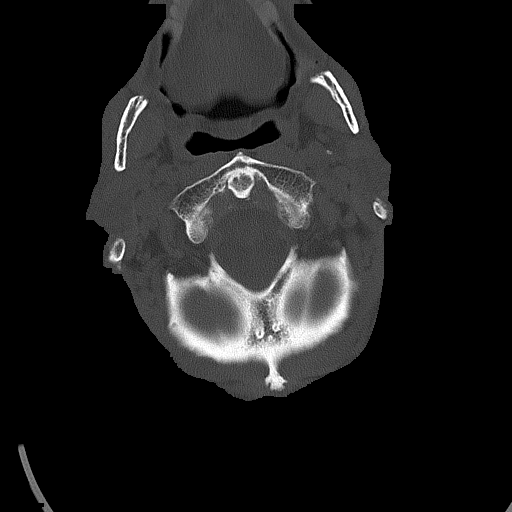
[im 18/90  bone]
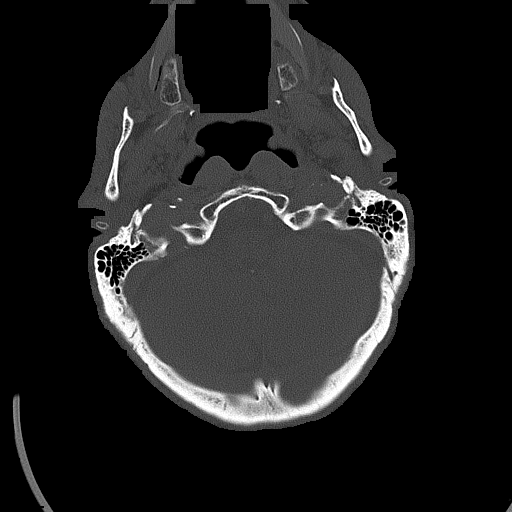
[im 27/90  bone]
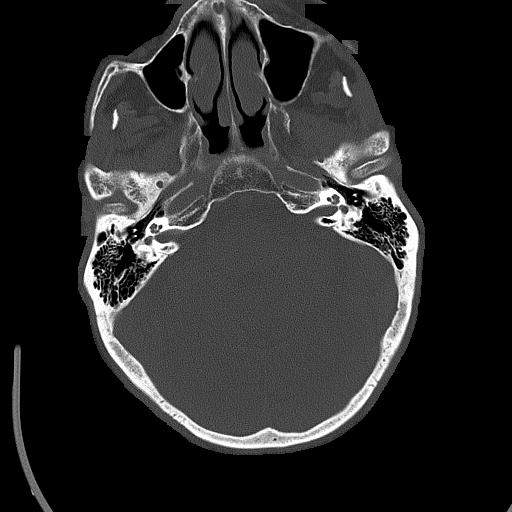

[Series 4: head 3.0 cor st · coronal · 0.35mm/px · 3 of 70 slices shown]
[im 24/70  brain]
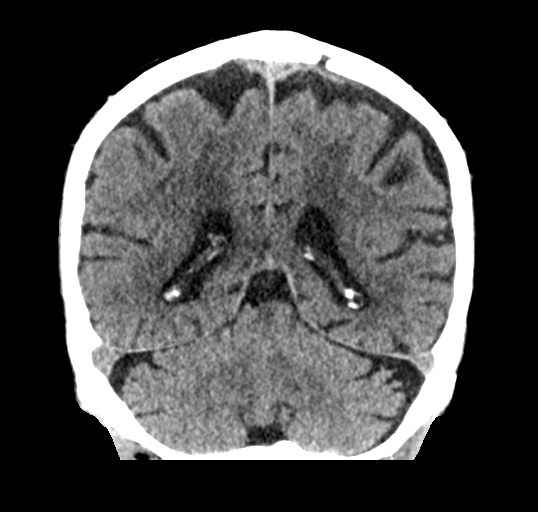
[im 31/70  brain]
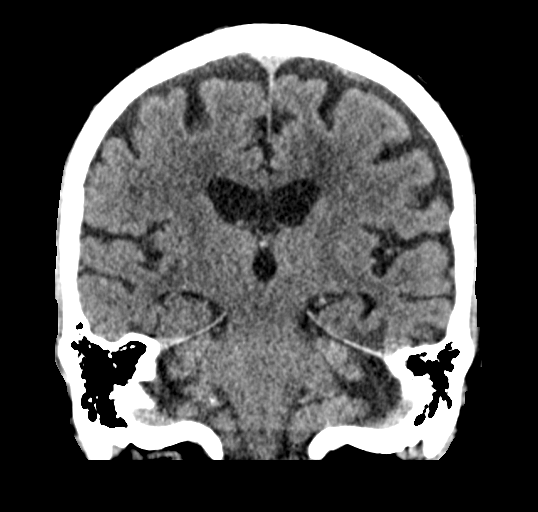
[im 39/70  brain]
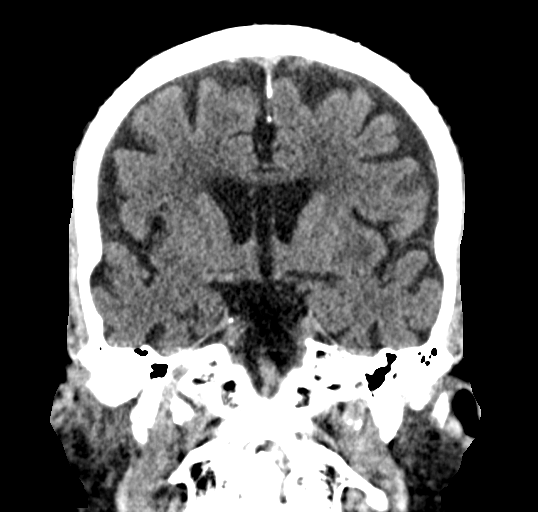

[Series 5: head 3.0 sag st · sagittal · 0.35mm/px · 3 of 64 slices shown]
[im 22/64  brain]
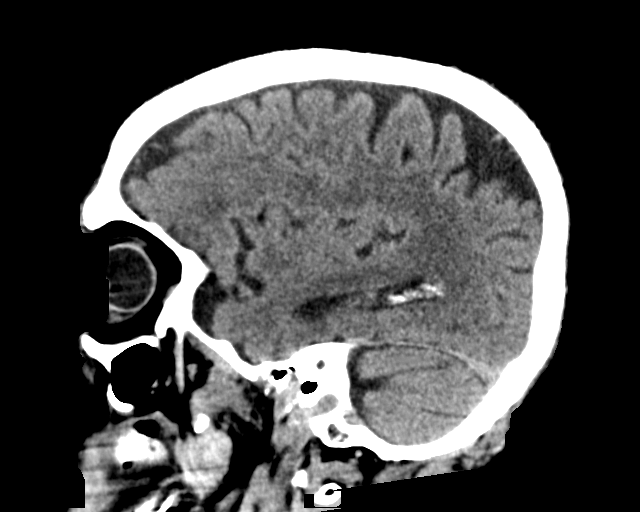
[im 32/64  brain]
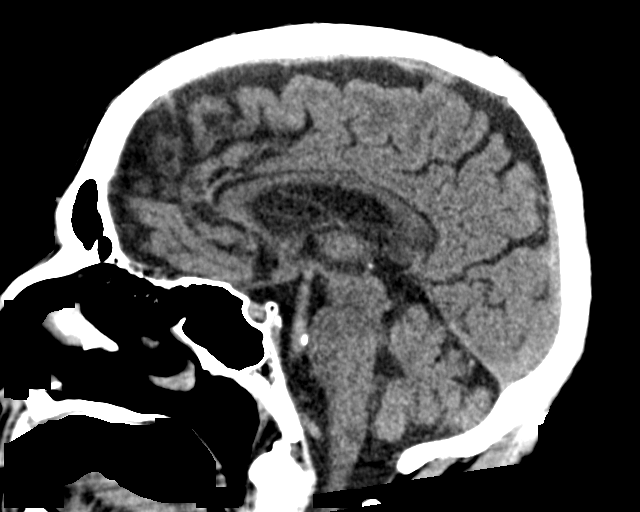
[im 43/64  brain]
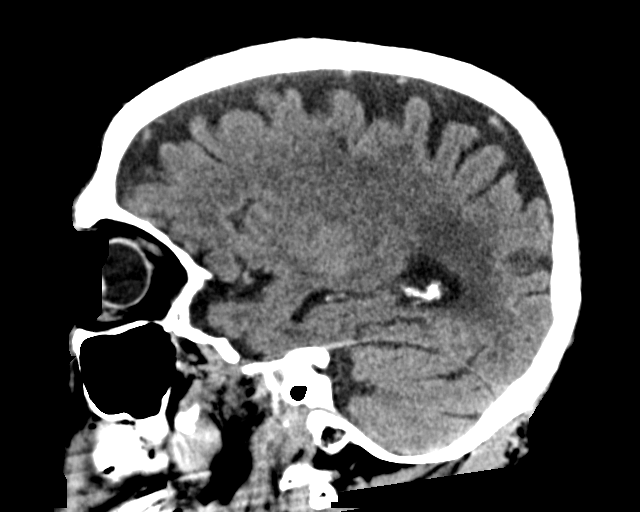

[16 of 47 positions shown; findings below may reference images not displayed]

FINDINGS: Brain: No acute infarct, acute intracranial hemorrhage, mass, or
midline shift is identified. Asymmetric extra-axial CSF over the
left cerebral convexity measures up to 8 mm in thickness in the
frontal region with mild underlying gyral flattening but no other
significant mass effect. The ventricles are normal in size. There is
a cavum septum pellucidum et vergae. Hypodensities in the cerebral
white matter bilaterally are nonspecific but compatible with
moderate chronic small vessel ischemic disease.

Vascular: Calcified atherosclerosis at the skull base. No hyperdense
vessel.

Skull: No fracture or suspicious osseous lesion.

Sinuses/Orbits: Paranasal sinuses and mastoid air cells are clear.
Unremarkable orbits.

Other: None.

ASPECTS ([place]berta Stroke Program Early CT Score)

- Ganglionic level infarction (caudate, lentiform nuclei, internal
capsule, insula, M1-M3 cortex): 7

- Supraganglionic infarction (M4-M6 cortex): 3

Total score (0-10 with 10 being normal): 10
IMPRESSION: 1. No evidence of acute intracranial abnormality.
2. ASPECTS is 10.
3. Moderate chronic small vessel ischemic disease.
4. 8 mm thick low-density collection over the left cerebral
convexity, likely a subdural hygroma. No significant mass effect.

These results were communicated to Dr. Yahir at [DATE] on 07/03/2019
by text page via the [employer] messaging system.
